# Patient Record
Sex: Female | Born: 1990 | Race: White | Hispanic: No | Marital: Single | Smoking: Never smoker
Health system: Southern US, Community
[De-identification: ages and names within clinical notes are randomized; demographics above are authoritative.]

## PROBLEM LIST (undated history)

## (undated) DIAGNOSIS — F329 Major depressive disorder, single episode, unspecified: Secondary | ICD-10-CM

## (undated) DIAGNOSIS — R87629 Unspecified abnormal cytological findings in specimens from vagina: Secondary | ICD-10-CM

## (undated) DIAGNOSIS — O99345 Other mental disorders complicating the puerperium: Secondary | ICD-10-CM

## (undated) DIAGNOSIS — F32A Depression, unspecified: Secondary | ICD-10-CM

## (undated) DIAGNOSIS — F53 Postpartum depression: Secondary | ICD-10-CM

## (undated) HISTORY — DX: Unspecified abnormal cytological findings in specimens from vagina: R87.629

## (undated) HISTORY — DX: Postpartum depression: F53.0

## (undated) HISTORY — DX: Other mental disorders complicating the puerperium: O99.345

## (undated) HISTORY — DX: Depression, unspecified: F32.A

## (undated) HISTORY — PX: OTHER SURGICAL HISTORY: SHX169

---
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
## 2016-07-02 ENCOUNTER — Ambulatory Visit (INDEPENDENT_AMBULATORY_CARE_PROVIDER_SITE_OTHER): Payer: Medicaid Other | Admitting: Certified Nurse Midwife

## 2016-07-02 VITALS — BP 118/70 | HR 91 | Ht 64.0 in | Wt 157.0 lb

## 2016-07-02 DIAGNOSIS — Z113 Encounter for screening for infections with a predominantly sexual mode of transmission: Secondary | ICD-10-CM

## 2016-07-02 DIAGNOSIS — Z1389 Encounter for screening for other disorder: Secondary | ICD-10-CM

## 2016-07-02 DIAGNOSIS — T7589XA Other specified effects of external causes, initial encounter: Secondary | ICD-10-CM

## 2016-07-02 DIAGNOSIS — Z8279 Family history of other congenital malformations, deformations and chromosomal abnormalities: Secondary | ICD-10-CM

## 2016-07-02 DIAGNOSIS — N912 Amenorrhea, unspecified: Secondary | ICD-10-CM

## 2016-07-02 DIAGNOSIS — Z369 Encounter for antenatal screening, unspecified: Secondary | ICD-10-CM

## 2016-07-02 DIAGNOSIS — Z3401 Encounter for supervision of normal first pregnancy, first trimester: Secondary | ICD-10-CM

## 2016-07-02 NOTE — Patient Instructions (Signed)
Pregnancy and Toxoplasmosis Toxoplasmosis is an infection that is caused by a parasite. Usually, there are no symptoms and the body can fight off the infection. If you get toxoplasmosis during pregnancy, there is a chance that the infection will spread to your baby. If this happens, your baby may develop serious health problems, such as blindness, intellectual disabilities, and other neurological disorders. Some of these problems may not show up for years. How do people get toxoplasmosis? You can get toxoplasmosis if:  You touch anything that is contaminated with infected cat feces and then you touch your mouth.  You eat raw or undercooked meat from an infected animal.  You eat fruits and vegetables that were grown in contaminated soil. Your baby can get toxoplasmosis through your blood supply if you are infected during pregnancy or just before pregnancy. How can I protect myself and my baby against toxoplasmosis?  Do not get a new cat.  Do not touch stray cats.  If you have a sandbox, cover it when it is not being used.  Avoid working in soil where cats may leave feces.  Wear gloves when you work in the soil. Wash your hands with soap and water when you are finished.  If you have a cat:  Have someone else change the cat's litter box daily. He or she should wash his or her hands afterward.  Do not let your cat outside.  Do not feed your cat any raw meat.  Do not eat undercooked meat, especially meat that has never been frozen.  Wash and peel all fruits and vegetables before eating them.  Avoid drinking untreated water.  If you have toxoplasmosis and you are not pregnant, wait at least 6 months before becoming pregnant. How do I know if I have toxoplasmosis? The only way to know for sure that you have toxoplasmosis is with a test. People with toxoplasmosis do not always have symptoms. If symptoms are present, they may include:  A fever.  Swollen glands.  Muscle  aches.  Headaches.  Feeling like you have a cold or the flu. If you think you have toxoplasmosis, or if you think that you may have been exposed to it, call your health care provider. How is toxoplasmosis diagnosed? When you become pregnant, your health care provider may order a blood test to check whether you have ever had toxoplasmosis.  If you have had toxoplasmosis infection before, you cannot get it again.  If you have never had toxoplasmosis, your health care provider may repeat this test at a later date.  If you become infected during pregnancy, your health care provider may do more tests to find out whether the infection has spread to the baby.  Other tests may include an ultrasound and a test of your amniotic fluid (amniocentesis). How is toxoplasmosis treated? Toxoplasmosis may be treated with antibiotics and other medicines.  Some of these medicines can lower your baby's chance of developing complications later on.  Medicines may need to be taken for up to one year. What should I do at home if I am diagnosed with toxoplasmosis?  Take over-the-counter and prescription medicines only as told by your health care provider.  If you were prescribed an antibiotic medicine, take it as told by your health care provider. Do not stop taking the antibiotic even if you start to feel better.  Keep all follow-up visits as told by your health care provider. This is important. This information is not intended to replace advice given to   you by your health care provider. Make sure you discuss any questions you have with your health care provider. Document Released: 07/19/2000 Document Revised: 12/11/2015 Document Reviewed: 11/16/2015 Elsevier Interactive Patient Education  2017 [employer]. Pregnancy and Zika Virus Disease Zika virus disease, or Zika, is an illness that can spread to people from mosquitoes that carry the virus. It may also spread from person to person through infected body  fluids. Zika first occurred in [place], but recently it has spread to new areas. The virus occurs in tropical climates. The location of Zika continues to change. Most people who become infected with Zika virus do not develop serious illness. However, Zika may cause birth defects in an unborn baby whose mother is infected with the virus. It may also increase the risk of miscarriage. What are the symptoms of Zika virus disease? In many cases, people who have been infected with Zika virus do not develop any symptoms. If symptoms appear, they usually start about a week after the person is infected. Symptoms are usually mild. They may include:  Fever.  Rash.  Red eyes.  Joint pain. How does Zika virus disease spread? The main way that Zika virus spreads is through the bite of a certain type of mosquito. Unlike most types of mosquitos, which bite only at night, the type of mosquito that carries Zika virus bites both at night and during the day. Zika virus can also spread through sexual contact, through a blood transfusion, and from a mother to her baby before or during birth. Once you have had Zika virus disease, it is unlikely that you will get it again. Can I pass Zika to my baby during pregnancy? Yes, Zika can pass from a mother to her baby before or during birth. What problems can Zika cause for my baby? A woman who is infected with Zika virus while pregnant is at risk of having her baby born with a condition in which the brain or head is smaller than expected (microcephaly). Babies who have microcephaly can have developmental delays, seizures, hearing problems, and vision problems. Having Zika virus disease during pregnancy can also increase the risk of miscarriage. How can Zika virus disease be prevented? There is no vaccine to prevent Zika. The best way to prevent the disease is to avoid infected mosquitoes and avoid exposure to body fluids that can spread the virus. Avoid any possible exposure  to Zika by taking the following precautions. For women and their sex partners:  Avoid traveling to high-risk areas. The locations where [place] is being reported change often. To identify high-risk areas, check the CDC travel website: http://davidson-gomez.com/  If you or your sex partner must travel to a high-risk area, talk with a health care provider before and after traveling.  Take all precautions to avoid mosquito bites if you live in, or travel to, any of the high-risk areas. Insect repellents are safe to use during pregnancy.  Ask your health care provider when it is safe to have sexual contact. For women:  If you are pregnant or trying to become pregnant, avoid sexual contact with persons who may have been exposed to [place] virus, persons who have possible symptoms of Zika, or persons whose history you are unsure about. If you choose to have sexual contact with someone who may have been exposed to [place] virus, use condoms correctly during the entire duration of sexual activity, every time. Do not share sexual devices, as you may be exposed to body fluids.  Ask your  health care provider about when it is safe to attempt pregnancy after a possible exposure to Zika virus. What steps should I take to avoid mosquito bites? Take these steps to avoid mosquito bites when you are in a high-risk area:  Wear loose clothing that covers your arms and legs.  Limit your outdoor activities.  Do not open windows unless they have window screens.  Sleep under mosquito nets.  Use insect repellent. The best insect repellents have:  DEET, picaridin, oil of lemon eucalyptus (OLE), or IR3535 in them.  Higher amounts of an active ingredient in them.  Remember that insect repellents are safe to use during pregnancy.  Do not use OLE on children who are younger than 293 years of age. Do not use insect repellent on babies who are younger than 382 months of age.  Cover your child's stroller with mosquito  netting. Make sure the netting fits snugly and that any loose netting does not cover your child's mouth or nose. Do not use a blanket as a mosquito-protection cover.  Do not apply insect repellent underneath clothing.  If you are using sunscreen, apply the sunscreen before applying the insect repellent.  Treat clothing with permethrin. Do not apply permethrin directly to your skin. Follow label directions for safe use.  Get rid of standing water, where mosquitoes may reproduce. Standing water is often found in items such as buckets, bowls, animal food dishes, and flowerpots. When you return from traveling to any high-risk area, continue taking actions to protect yourself against mosquito bites for 3 weeks, even if you show no signs of illness. This will prevent spreading Zika virus to uninfected mosquitoes. What should I know about the sexual transmission of Zika? People can spread Zika to their sexual partners during vaginal, anal, or oral sex, or by sharing sexual devices. Many people with [place] do not develop symptoms, so a person could spread the disease without knowing that they are infected. The greatest risk is to women who are pregnant or who may become pregnant. Zika virus can live longer in semen than it can live in blood. Couples can prevent sexual transmission of the virus by:  Using condoms correctly during the entire duration of sexual activity, every time. This includes vaginal, anal, and oral sex.  Not sharing sexual devices. Sharing increases your risk of being exposed to body fluid from another person.  Avoiding all sexual activity until your health care provider says it is safe. Should I be tested for Zika virus? A sample of your blood can be tested for Zika virus. A pregnant woman should be tested if she may have been exposed to the virus or if she has symptoms of Zika. She may also have additional tests done during her pregnancy, such ultrasound testing. Talk with your health  care provider about which tests are recommended. This information is not intended to replace advice given to you by your health care provider. Make sure you discuss any questions you have with your health care provider. Document Released: 01/01/2015 Document Revised: 09/18/2015 Document Reviewed: 12/25/2014 Elsevier Interactive Patient Education  2017 Else[employer] Inc. Minor Illnesses and Medications in Pregnancy  Cold/Flu:  Sudafed for congestion- Robitussin (plain) for cough- Tylenol for discomfort.  Please follow the directions on the label.  Try not to take any more than needed.  OTC Saline nasal spray and air humidifier or cool-mist  Vaporizer to sooth nasal irritation and to loosen congestion.  It is also important to increase intake of non carbonated  fluids, especially if you have a fever.  Constipation:  Colace-2 capsules at bedtime; Metamucil- follow directions on label; Senokot- 1 tablet at bedtime.  Any one of these medications can be used.  It is also very important to increase fluids and fruits along with regular exercise.  If problem persists please call the office.  Diarrhea:  Kaopectate as directed on the label.  Eat a bland diet and increase fluids.  Avoid highly seasoned foods.  Headache:  Tylenol 1 or 2 tablets every 3-4 hours as needed  Indigestion:  Maalox, Mylanta, Tums or Rolaids- as directed on label.  Also try to eat small meals and avoid fatty, greasy or spicy foods.  Nausea with or without Vomiting:  Nausea in pregnancy is caused by increased levels of hormones in the body which influence the digestive system and cause irritation when stomach acids accumulate.  Symptoms usually subside after 1st trimester of pregnancy.  Try the following: 1. Keep saltines, graham crackers or dry toast by your bed to eat upon awakening. 2. Don't let your stomach get empty.  Try to eat 5-6 small meals per day instead of 3 large ones. 3. Avoid greasy fatty or highly seasoned foods.  4. Take  OTC Unisom 1 tablet at bed time along with OTC Vitamin B6 25-50 mg 3 times per day.    If nausea continues with vomiting and you are unable to keep down food and fluids you may need a prescription medication.  Please notify your provider.   Sore throat:  Chloraseptic spray, throat lozenges and or plain Tylenol.  Vaginal Yeast Infection:  OTC Monistat for 7 days as directed on label.  If symptoms do not resolve within a week notify provider.  If any of the above problems do not subside with recommended treatment please call the office for further assistance.   Do not take Aspirin, Advil, Motrin or Ibuprofen.  * * OTC= Over the counter Hyperemesis Gravidarum Hyperemesis gravidarum is a severe form of nausea and vomiting that happens during pregnancy. Hyperemesis is worse than morning sickness. It may cause you to have nausea or vomiting all day for many days. It may keep you from eating and drinking enough food and liquids. Hyperemesis usually occurs during the first half (the first 20 weeks) of pregnancy. It often goes away once a woman is in her second half of pregnancy. However, sometimes hyperemesis continues through an entire pregnancy. What are the causes? The cause of this condition is not known. It may be related to changes in chemicals (hormones) in the body during pregnancy, such as the high level of pregnancy hormone (human chorionic gonadotropin) or the increase in the female sex hormone (estrogen). What are the signs or symptoms? Symptoms of this condition include:  Severe nausea and vomiting.  Nausea that does not go away.  Vomiting that does not allow you to keep any food down.  Weight loss.  Body fluid loss (dehydration).  Having no desire to eat, or not liking food that you have previously enjoyed. How is this diagnosed? This condition may be diagnosed based on:  A physical exam.  Your medical history.  Your symptoms.  Blood tests.  Urine tests. How is this  treated? This condition may be managed with medicine. If medicines to do not help relieve nausea and vomiting, you may need to receive fluids through an IV tube at the hospital. Follow these instructions at home:  Take over-the-counter and prescription medicines only as told by your health care  provider.  Avoid iron pills and multivitamins that contain iron for the first 3-4 months of pregnancy. If you take prescription iron pills, do not stop taking them unless your health care provider approves.  Take the following actions to help prevent nausea and vomiting:  In the morning, before getting out of bed, try eating a couple of dry crackers or a piece of toast.  Avoid foods and smells that upset your stomach. Fatty and spicy foods may make nausea worse.  Eat 5-6 small meals a day.  Do not drink fluids while eating meals. Drink between meals.  Eat or suck on things that have ginger in them. Ginger can help relieve nausea.  Avoid food preparation. The smell of food can spoil your appetite or trigger nausea.  Follow instructions from your health care provider about eating or drinking restrictions.  For snacks, eat high-protein foods, such as cheese.  Keep all follow-up and pre-birth (prenatal) visits as told by your health care provider. This is important. Contact a health care provider if:  You have pain in your abdomen.  You have a severe headache.  You have vision problems.  You are losing weight. Get help right away if:  You cannot drink fluids without vomiting.  You vomit blood.  You have constant nausea and vomiting.  You are very weak.  You are very thirsty.  You feel dizzy.  You faint.  You have a fever or other symptoms that last for more than 2-3 days.  You have a fever and your symptoms suddenly get worse. Summary  Hyperemesis gravidarum is a severe form of nausea and vomiting that happens during pregnancy.  Making some changes to your eating habits  may help relieve nausea and vomiting.  This condition may be managed with medicine.  If medicines to do not help relieve nausea and vomiting, you may need to receive fluids through an IV tube at the hospital. This information is not intended to replace advice given to you by your health care provider. Make sure you discuss any questions you have with your health care provider. Document Released: 04/12/2005 Document Revised: 12/10/2015 Document Reviewed: 12/10/2015 Elsevier Interactive Patient Education  2017 [employer]. First Trimester of Pregnancy The first trimester of pregnancy is from week 1 until the end of week 13 (months 1 through 3). During this time, your baby will begin to develop inside you. At 6-8 weeks, the eyes and face are formed, and the heartbeat can be seen on ultrasound. At the end of 12 weeks, all the baby's organs are formed. Prenatal care is all the medical care you receive before the birth of your baby. Make sure you get good prenatal care and follow all of your doctor's instructions. Follow these instructions at home: Medicines   Take over-the-counter and prescription medicines only as told by your doctor. Some medicines are safe and some medicines are not safe during pregnancy.  Take a prenatal vitamin that contains at least 600 micrograms (mcg) of folic acid.  If you have trouble pooping (constipation), take medicine that will make your stool soft (stool softener) if your doctor approves. Eating and drinking   Eat regular, healthy meals.  Your doctor will tell you the amount of weight gain that is right for you.  Avoid raw meat and uncooked cheese.  If you feel sick to your stomach (nauseous) or throw up (vomit):  Eat 4 or 5 small meals a day instead of 3 large meals.  Try eating a few soda  crackers.  Drink liquids between meals instead of during meals.  To prevent constipation:  Eat foods that are high in fiber, like fresh fruits and vegetables, whole  grains, and beans.  Drink enough fluids to keep your pee (urine) clear or pale yellow. Activity   Exercise only as told by your doctor. Stop exercising if you have cramps or pain in your lower belly (abdomen) or low back.  Do not exercise if it is too hot, too humid, or if you are in a place of great height (high altitude).  Try to avoid standing for long periods of time. Move your legs often if you must stand in one place for a long time.  Avoid heavy lifting.  Wear low-heeled shoes. Sit and stand up straight.  You can have sex unless your doctor tells you not to. Relieving pain and discomfort   Wear a good support bra if your breasts are sore.  Take warm water baths (sitz baths) to soothe pain or discomfort caused by hemorrhoids. Use hemorrhoid cream if your doctor says it is okay.  Rest with your legs raised if you have leg cramps or low back pain.  If you have puffy, bulging veins (varicose veins) in your legs:  Wear support hose or compression stockings as told by your doctor.  Raise (elevate) your feet for 15 minutes, 3-4 times a day.  Limit salt in your food. Prenatal care   Schedule your prenatal visits by the twelfth week of pregnancy.  Write down your questions. Take them to your prenatal visits.  Keep all your prenatal visits as told by your doctor. This is important. Safety   Wear your seat belt at all times when driving.  Make a list of emergency phone numbers. The list should include numbers for family, friends, the hospital, and police and fire departments. General instructions   Ask your doctor for a referral to a local prenatal class. Begin classes no later than at the start of month 6 of your pregnancy.  Ask for help if you need counseling or if you need help with nutrition. Your doctor can give you advice or tell you where to go for help.  Do not use hot tubs, steam rooms, or saunas.  Do not douche or use tampons or scented sanitary pads.  Do  not cross your legs for long periods of time.  Avoid all herbs and alcohol. Avoid drugs that are not approved by your doctor.  Do not use any tobacco products, including cigarettes, chewing tobacco, and electronic cigarettes. If you need help quitting, ask your doctor. You may get counseling or other support to help you quit.  Avoid cat litter boxes and soil used by cats. These carry germs that can cause birth defects in the baby and can cause a loss of your baby (miscarriage) or stillbirth.  Visit your dentist. At home, brush your teeth with a soft toothbrush. Be gentle when you floss. Contact a doctor if:  You are dizzy.  You have mild cramps or pressure in your lower belly.  You have a nagging pain in your belly area.  You continue to feel sick to your stomach, you throw up, or you have watery poop (diarrhea).  You have a bad smelling fluid coming from your vagina.  You have pain when you pee (urinate).  You have increased puffiness (swelling) in your face, hands, legs, or ankles. Get help right away if:  You have a fever.  You are leaking fluid from  your vagina.  You have spotting or bleeding from your vagina.  You have very bad belly cramping or pain.  You gain or lose weight rapidly.  You throw up blood. It may look like coffee grounds.  You are around people who have [place] measles, fifth disease, or chickenpox.  You have a very bad headache.  You have shortness of breath.  You have any kind of trauma, such as from a fall or a car accident. Summary  The first trimester of pregnancy is from week 1 until the end of week 13 (months 1 through 3).  To take care of yourself and your unborn baby, you will need to eat healthy meals, take medicines only if your doctor tells you to do so, and do activities that are safe for you and your baby.  Keep all follow-up visits as told by your doctor. This is important as your doctor will have to ensure that your baby is  healthy and growing well. This information is not intended to replace advice given to you by your health care provider. Make sure you discuss any questions you have with your health care provider. Document Released: 09/29/2007 Document Revised: 04/20/2016 Document Reviewed: 04/20/2016 Elsevier Interactive Patient Education  2017 [employer] Inc. Commonly Asked Questions During Pregnancy  Cats: A parasite can be excreted in cat feces.  To avoid exposure you need to have another person empty the little box.  If you must empty the litter box you will need to wear gloves.  Wash your hands after handling your cat.  This parasite can also be found in raw or undercooked meat so this should also be avoided.  Colds, Sore Throats, Flu: Please check your medication sheet to see what you can take for symptoms.  If your symptoms are unrelieved by these medications please call the office.  Dental Work: Most any dental work Agricultural consultant recommends is permitted.  X-rays should only be taken during the first trimester if absolutely necessary.  Your abdomen should be shielded with a lead apron during all x-rays.  Please notify your provider prior to receiving any x-rays.  Novocaine is fine; gas is not recommended.  If your dentist requires a note from [place] prior to dental work please call the office and we will provide one for you.  Exercise: Exercise is an important part of staying healthy during your pregnancy.  You may continue most exercises you were accustomed to prior to pregnancy.  Later in your pregnancy you will most likely notice you have difficulty with activities requiring balance like riding a bicycle.  It is important that you listen to your body and avoid activities that put you at a higher risk of falling.  Adequate rest and staying well hydrated are a must!  If you have questions about the safety of specific activities ask your provider.    Exposure to Children with illness: Try to avoid obvious exposure;  report any symptoms to [place] when noted,  If you have chicken pos, red measles or mumps, you should be immune to these diseases.   Please do not take any vaccines while pregnant unless you have checked with your OB provider.  Fetal Movement: After 28 weeks we recommend you do "kick counts" twice daily.  Lie or sit down in a calm quiet environment and count your baby movements "kicks".  You should feel your baby at least 10 times per hour.  If you have not felt 10 kicks within the first hour get up,  walk around and have something sweet to eat or drink then repeat for an additional hour.  If count remains less than 10 per hour notify your provider.  Fumigating: Follow your pest control agent's advice as to how long to stay out of your home.  Ventilate the area well before re-entering.  Hemorrhoids:   Most over-the-counter preparations can be used during pregnancy.  Check your medication to see what is safe to use.  It is important to use a stool softener or fiber in your diet and to drink lots of liquids.  If hemorrhoids seem to be getting worse please call the office.   Hot Tubs:  Hot tubs Jacuzzis and saunas are not recommended while pregnant.  These increase your internal body temperature and should be avoided.  Intercourse:  Sexual intercourse is safe during pregnancy as long as you are comfortable, unless otherwise advised by your provider.  Spotting may occur after intercourse; report any bright red bleeding that is heavier than spotting.  Labor:  If you know that you are in labor, please go to the hospital.  If you are unsure, please call the office and let us help you decide what to do.  Lifting, straining, etc:  If your job requires heavy lifting or straining please check with your provider for any limitations.  Generally, you should not lift items heavier than that you can lift simply with your hands and arms (no back muscles)  Painting:  Paint fumes do not harm your pregnancy, but may make you  ill and should be avoided if possible.  Latex or water based paints have less odor than oils.  Use adequate ventilation while painting.  Permanents & Hair Color:  Chemicals in hair dyes are not recommended as they cause increase hair dryness which can increase hair loss during pregnancy.  " Highlighting" and permanents are allowed.  Dye may be absorbed differently and permanents may not hold as well during pregnancy.  Sunbathing:  Use a sunscreen, as skin burns easily during pregnancy.  Drink plenty of fluids; avoid over heating.  Tanning Beds:  Because their possible side effects are still unknown, tanning beds are not recommended.  Ultrasound Scans:  Routine ultrasounds are performed at approximately 20 weeks.  You will be able to see your baby's general anatomy an if you would like to know the gender this can usually be determined as well.  If it is questionable when you conceived you may also receive an ultrasound early in your pregnancy for dating purposes.  Otherwise ultrasound exams are not routinely performed unless there is a medical necessity.  Although you can request a scan we ask that you pay for it when conducted because insurance does not cover " patient request" scans.  Work: If your pregnancy proceeds without complications you may work until your due date, unless your physician or employer advises otherwise.  Round Ligament Pain/Pelvic Discomfort:  Sharp, shooting pains not associated with bleeding are fairly common, usually occurring in the second trimester of pregnancy.  They tend to be worse when standing up or when you remain standing for long periods of time.  These are the result of pressure of certain pelvic ligaments called "round ligaments".  Rest, Tylenol and heat seem to be the most effective relief.  As the womb and fetus grow, they rise out of the pelvis and the discomfort improves.  Please notify the office if your pain seems different than that described.  It may represent  a more serious  condition.

## 2016-07-02 NOTE — Progress Notes (Signed)
Lindsay RumbleAllison Mcdowell presents for NOB nurse interview visit. Pregnancy confirmation done at [facility] on 05/17/2016.  Lmp: 04/11/2016 (shorter than usual). G-1.  P-0000. Pregnancy education material explained and given. Has cat in the home. She is unsure whether or not she changed the litter box since pregnant but will do a Toxoplasmosis.  NOB labs ordered.  HIV labs and Drug screen were explained optional and she did not decline. Drug screen ordered. PNV encouraged. Genetic screening options discussed. Genetic testing: Ordered-MaterniT.  Ultrasound ordered due to irregular menses for dating and viability. Pt. To follow up with provider in 1 weeks for NOB physical.  All questions answered.

## 2016-07-04 LAB — GC/CHLAMYDIA PROBE AMP
Chlamydia trachomatis, NAA: NEGATIVE
Neisseria gonorrhoeae by PCR: NEGATIVE

## 2016-07-05 ENCOUNTER — Ambulatory Visit (INDEPENDENT_AMBULATORY_CARE_PROVIDER_SITE_OTHER): Payer: Medicaid Other

## 2016-07-05 DIAGNOSIS — Z3401 Encounter for supervision of normal first pregnancy, first trimester: Secondary | ICD-10-CM

## 2016-07-05 LAB — MONITOR DRUG PROFILE 14(MW)
Amphetamine Scrn, Ur: NEGATIVE ng/mL
BARBITURATE SCREEN URINE: NEGATIVE ng/mL
BENZODIAZEPINE SCREEN, URINE: NEGATIVE ng/mL
BUPRENORPHINE, URINE: NEGATIVE ng/mL
CANNABINOIDS UR QL SCN: NEGATIVE ng/mL
COCAINE(METAB.)SCREEN, URINE: NEGATIVE ng/mL
CREATININE(CRT), U: 67 mg/dL (ref 20.0–300.0)
Fentanyl, Urine: NEGATIVE pg/mL
MEPERIDINE SCREEN, URINE: NEGATIVE ng/mL
METHADONE SCREEN, URINE: NEGATIVE ng/mL
OXYCODONE+OXYMORPHONE UR QL SCN: NEGATIVE ng/mL
Opiate Scrn, Ur: NEGATIVE ng/mL
PH UR, DRUG SCRN: 6.1 (ref 4.5–8.9)
PHENCYCLIDINE QUANTITATIVE URINE: NEGATIVE ng/mL
Propoxyphene Scrn, Ur: NEGATIVE ng/mL
SPECIFIC GRAVITY: 1.009
Tramadol Screen, Urine: NEGATIVE ng/mL

## 2016-07-05 LAB — HIV ANTIBODY (ROUTINE TESTING W REFLEX): HIV Screen 4th Generation wRfx: NONREACTIVE

## 2016-07-05 LAB — CBC WITH DIFFERENTIAL/PLATELET
BASOS ABS: 0 10*3/uL (ref 0.0–0.2)
Basos: 0 %
EOS (ABSOLUTE): 0.1 10*3/uL (ref 0.0–0.4)
Eos: 1 %
HEMOGLOBIN: 12.8 g/dL (ref 11.1–15.9)
Hematocrit: 37 % (ref 34.0–46.6)
IMMATURE GRANS (ABS): 0 10*3/uL (ref 0.0–0.1)
Immature Granulocytes: 0 %
LYMPHS ABS: 1.4 10*3/uL (ref 0.7–3.1)
LYMPHS: 17 %
MCH: 30.3 pg (ref 26.6–33.0)
MCHC: 34.6 g/dL (ref 31.5–35.7)
MCV: 88 fL (ref 79–97)
MONOCYTES: 8 %
Monocytes Absolute: 0.7 10*3/uL (ref 0.1–0.9)
Neutrophils Absolute: 6.1 10*3/uL (ref 1.4–7.0)
Neutrophils: 74 %
PLATELETS: 313 10*3/uL (ref 150–379)
RBC: 4.22 x10E6/uL (ref 3.77–5.28)
RDW: 13.3 % (ref 12.3–15.4)
WBC: 8.3 10*3/uL (ref 3.4–10.8)

## 2016-07-05 LAB — URINALYSIS, ROUTINE W REFLEX MICROSCOPIC
BILIRUBIN UA: NEGATIVE
Glucose, UA: NEGATIVE
Ketones, UA: NEGATIVE
Nitrite, UA: POSITIVE — AB
PH UA: 6.5 (ref 5.0–7.5)
PROTEIN UA: NEGATIVE
RBC UA: NEGATIVE
Specific Gravity, UA: 1.011 (ref 1.005–1.030)
Urobilinogen, Ur: 0.2 mg/dL (ref 0.2–1.0)

## 2016-07-05 LAB — MICROSCOPIC EXAMINATION: Casts: NONE SEEN /LPF

## 2016-07-05 LAB — RUBELLA SCREEN: Rubella Antibodies, IGG: <0.9 index — ABNORMAL LOW (ref >0.99)

## 2016-07-05 LAB — RH TYPE: RH TYPE: POSITIVE

## 2016-07-05 LAB — NICOTINE SCREEN, URINE: COTININE UR QL SCN: NEGATIVE ng/mL

## 2016-07-05 LAB — HEPATITIS B SURFACE ANTIGEN: HEP B S AG: NEGATIVE

## 2016-07-05 LAB — ABO

## 2016-07-05 LAB — VARICELLA ZOSTER ANTIBODY, IGG: Varicella zoster IgG: 1768 index (ref >165)

## 2016-07-05 LAB — TOXOPLASMA ANTIBODIES- IGG AND  IGM: Toxoplasma IgG Ratio: <3 IU/mL (ref 0.0–7.1)

## 2016-07-05 LAB — RPR: RPR: NONREACTIVE

## 2016-07-05 LAB — ANTIBODY SCREEN: ANTIBODY SCREEN: NEGATIVE

## 2016-07-06 ENCOUNTER — Other Ambulatory Visit: Payer: Self-pay | Admitting: Certified Nurse Midwife

## 2016-07-06 ENCOUNTER — Encounter: Payer: Self-pay | Admitting: Certified Nurse Midwife

## 2016-07-06 MED ORDER — NITROFURANTOIN MONOHYD MACRO 100 MG PO CAPS: 100.0000 mg | ORAL_CAPSULE | Freq: Two times a day (BID) | ORAL | 0 refills | Status: AC

## 2016-07-06 NOTE — Progress Notes (Signed)
U/A was positive for UTI, will treat with Macrobid. Message sent on my chart.   Doreene BurkeAnnie Brittnae Aschenbrenner, CNM

## 2016-07-09 ENCOUNTER — Other Ambulatory Visit: Payer: Self-pay

## 2016-07-09 ENCOUNTER — Encounter: Payer: Self-pay | Admitting: Certified Nurse Midwife

## 2016-07-09 ENCOUNTER — Ambulatory Visit (INDEPENDENT_AMBULATORY_CARE_PROVIDER_SITE_OTHER): Payer: Medicaid Other | Admitting: Certified Nurse Midwife

## 2016-07-09 ENCOUNTER — Other Ambulatory Visit: Payer: Self-pay | Admitting: Certified Nurse Midwife

## 2016-07-09 VITALS — BP 103/67 | HR 72 | Wt 159.3 lb

## 2016-07-09 DIAGNOSIS — N898 Other specified noninflammatory disorders of vagina: Secondary | ICD-10-CM | POA: Diagnosis not present

## 2016-07-09 DIAGNOSIS — Z3401 Encounter for supervision of normal first pregnancy, first trimester: Secondary | ICD-10-CM

## 2016-07-09 DIAGNOSIS — Z3481 Encounter for supervision of other normal pregnancy, first trimester: Secondary | ICD-10-CM | POA: Diagnosis not present

## 2016-07-09 DIAGNOSIS — Z369 Encounter for antenatal screening, unspecified: Secondary | ICD-10-CM

## 2016-07-09 LAB — POCT URINALYSIS DIPSTICK
Bilirubin, UA: NEGATIVE
Glucose, UA: NEGATIVE
Ketones, UA: NEGATIVE
NITRITE UA: NEGATIVE
PROTEIN UA: NEGATIVE
RBC UA: NEGATIVE
SPEC GRAV UA: 1.005 (ref 1.030–1.035)
UROBILINOGEN UA: NEGATIVE (ref ?–2.0)
pH, UA: 7 (ref 5.0–8.0)

## 2016-07-09 NOTE — Progress Notes (Signed)
NEW OB HISTORY AND PHYSICAL  SUBJECTIVE:       Lindsay Mcdowell is a 26 y.o. G1P0 female, Patient's last menstrual period was 04/11/2016 (exact date)., Estimated Date of Delivery: 01/16/17, [redacted]w[redacted]d, presents today for establishment of Prenatal Care. She has no unusual complaints.     Gynecologic History Patient's last menstrual period was 04/11/2016 (exact date). Normal  Contraception: none Last Pap: "a few yrs ago". Results were: normal per pt.   Obstetric History OB History  Gravida Para Term Preterm AB Living  1            SAB TAB Ectopic Multiple Live Births               # Outcome Date GA Lbr Len/2nd Weight Sex Delivery Anes PTL Lv  1 Current               History reviewed. No pertinent past medical history.  Past Surgical History:  Procedure Laterality Date  . none      Current Outpatient Prescriptions on File Prior to Visit  Medication Sig Dispense Refill  . nitrofurantoin, macrocrystal-monohydrate, (MACROBID) 100 MG capsule Take 1 capsule (100 mg total) by mouth 2 (two) times daily. 14 capsule 0  . Prenatal Vit-Fe Fumarate-FA (PRENATAL VITAMINS) 28-0.8 MG TABS Take by mouth.     No current facility-administered medications on file prior to visit.     Allergies  Allergen Reactions  . Codeine Itching    Pt unsure of reaction, fhx of allergy    Social History   Social History  . Marital status: Single    Spouse name: N/A  . Number of children: N/A  . Years of education: N/A   Occupational History  . Not on file.   Social History Main Topics  . Smoking status: Never Smoker  . Smokeless tobacco: Never Used  . Alcohol use No     Comment: not since pregnant  . Drug use: No  . Sexual activity: Yes    Partners: Male    Birth control/ protection: None   Other Topics Concern  . Not on file   Social History Narrative  . No narrative on file    Family History  Problem Relation Age of Onset  . Hypertension Mother   . Migraines Mother   . Asthma Maternal  Grandmother   . Diabetes Maternal Grandmother   . Rashes / Skin problems Maternal Grandmother     eczema    The following portions of the patient's history were reviewed and updated as appropriate: allergies, current medications, past OB history, past medical history, past surgical history, past family history, past social history, and problem list.  Review of Systems  Constitutional: Negative.   HENT: Negative.   Eyes: Negative.   Respiratory: Negative.   Cardiovascular: Negative.   Gastrointestinal: Negative.   Genitourinary: Negative.        Vaginal discharge and itching  Musculoskeletal: Negative.   Skin: Negative.   Neurological: Negative.   Endo/Heme/Allergies: Negative.   Psychiatric/Behavioral: Negative.      OBJECTIVE: Initial Physical Exam (New OB)  GENERAL APPEARANCE: alert, well appearing, in no apparent distress, oriented to person, place and time HEAD: normocephalic, atraumatic MOUTH: mucous membranes moist, pharynx normal without lesions THYROID: no thyromegaly or masses present BREASTS: no masses noted, no significant tenderness, no palpable axillary nodes, no skin changes LUNGS: clear to auscultation, no wheezes, rales or rhonchi, symmetric air entry HEART: regular rate and rhythm, no murmurs ABDOMEN: soft, nontender, nondistended, no  abnormal masses, no epigastric pain EXTREMITIES: no redness or tenderness in the calves or thighs SKIN: normal coloration and turgor, no rashes LYMPH NODES: no adenopathy palpable NEUROLOGIC: alert, oriented, normal speech, no focal findings or movement disorder noted  PELVIC EXAM EXTERNAL GENITALIA: normal appearing vulva with no masses, tenderness or lesions VAGINA: white discharge with redness CERVIX: no lesions or cervical motion tenderness and discharge white particulate  UTERUS: gravid and consistent with 12 weeks ADNEXA: no masses palpable and nontender OB EXAM PELVIMETRY: appears adequate RECTUM: exam not  indicated  ASSESSMENT: Normal pregnancy Yeast infection  PLAN: Prenatal care New OB counseling:The patient has been given an overview regarding routine prenatal care. Recommendations regarding diet, medications, weight gain, and exercise in pregnancy were given. Prenatal testing, Meternit redrawn today. Benefits of Breast Feeding were discussed. The patient is encouraged to consider nursing her baby post partum. Nuswab completed with pap, will send my chart message with results. Treat yeast with Monistat. ROB 4 wks.   Doreene BurkeAnnie Yarenis Cerino, CNM  See orders

## 2016-07-09 NOTE — Progress Notes (Signed)
NOB pe- last pap 11/2014- wnl/ C/o y/I- white d/c and itch. No odor.

## 2016-07-09 NOTE — Patient Instructions (Signed)
Minor Illnesses and Medications in Pregnancy  Cold/Flu:  Sudafed for congestion- Robitussin (plain) for cough- Tylenol for discomfort.  Please follow the directions on the label.  Try not to take any more than needed.  OTC Saline nasal spray and air humidifier or cool-mist  Vaporizer to sooth nasal irritation and to loosen congestion.  It is also important to increase intake of non carbonated fluids, especially if you have a fever.  Constipation:  Colace-2 capsules at bedtime; Metamucil- follow directions on label; Senokot- 1 tablet at bedtime.  Any one of these medications can be used.  It is also very important to increase fluids and fruits along with regular exercise.  If problem persists please call the office.  Diarrhea:  Kaopectate as directed on the label.  Eat a bland diet and increase fluids.  Avoid highly seasoned foods.  Headache:  Tylenol 1 or 2 tablets every 3-4 hours as needed  Indigestion:  Maalox, Mylanta, Tums or Rolaids- as directed on label.  Also try to eat small meals and avoid fatty, greasy or spicy foods.  Nausea with or without Vomiting:  Nausea in pregnancy is caused by increased levels of hormones in the body which influence the digestive system and cause irritation when stomach acids accumulate.  Symptoms usually subside after 1st trimester of pregnancy.  Try the following: 1. Keep saltines, graham crackers or dry toast by your bed to eat upon awakening. 2. Don't let your stomach get empty.  Try to eat 5-6 small meals per day instead of 3 large ones. 3. Avoid greasy fatty or highly seasoned foods.  4. Take OTC Unisom 1 tablet at bed time along with OTC Vitamin B6 25-50 mg 3 times per day.    If nausea continues with vomiting and you are unable to keep down food and fluids you may need a prescription medication.  Please notify your provider.   Sore throat:  Chloraseptic spray, throat lozenges and or plain Tylenol.  Vaginal Yeast Infection:  OTC Monistat for 7 days as  directed on label.  If symptoms do not resolve within a week notify provider.  If any of the above problems do not subside with recommended treatment please call the office for further assistance.   Do not take Aspirin, Advil, Motrin or Ibuprofen.  * * OTC= Over the counter Commonly Asked Questions During Pregnancy  Cats: A parasite can be excreted in cat feces.  To avoid exposure you need to have another person empty the little box.  If you must empty the litter box you will need to wear gloves.  Wash your hands after handling your cat.  This parasite can also be found in raw or undercooked meat so this should also be avoided.  Colds, Sore Throats, Flu: Please check your medication sheet to see what you can take for symptoms.  If your symptoms are unrelieved by these medications please call the office.  Dental Work: Most any dental work Investment banker, corporate recommends is permitted.  X-rays should only be taken during the first trimester if absolutely necessary.  Your abdomen should be shielded with a lead apron during all x-rays.  Please notify your provider prior to receiving any x-rays.  Novocaine is fine; gas is not recommended.  If your dentist requires a note from [place] prior to dental work please call the office and we will provide one for you.  Exercise: Exercise is an important part of staying healthy during your pregnancy.  You may continue most exercises you were  accustomed to prior to pregnancy.  Later in your pregnancy you will most likely notice you have difficulty with activities requiring balance like riding a bicycle.  It is important that you listen to your body and avoid activities that put you at a higher risk of falling.  Adequate rest and staying well hydrated are a must!  If you have questions about the safety of specific activities ask your provider.    Exposure to Children with illness: Try to avoid obvious exposure; report any symptoms to [place] when noted,  If you have chicken pos, red  measles or mumps, you should be immune to these diseases.   Please do not take any vaccines while pregnant unless you have checked with your OB provider.  Fetal Movement: After 28 weeks we recommend you do "kick counts" twice daily.  Lie or sit down in a calm quiet environment and count your baby movements "kicks".  You should feel your baby at least 10 times per hour.  If you have not felt 10 kicks within the first hour get up, walk around and have something sweet to eat or drink then repeat for an additional hour.  If count remains less than 10 per hour notify your provider.  Fumigating: Follow your pest control agent's advice as to how long to stay out of your home.  Ventilate the area well before re-entering.  Hemorrhoids:   Most over-the-counter preparations can be used during pregnancy.  Check your medication to see what is safe to use.  It is important to use a stool softener or fiber in your diet and to drink lots of liquids.  If hemorrhoids seem to be getting worse please call the office.   Hot Tubs:  Hot tubs Jacuzzis and saunas are not recommended while pregnant.  These increase your internal body temperature and should be avoided.  Intercourse:  Sexual intercourse is safe during pregnancy as long as you are comfortable, unless otherwise advised by your provider.  Spotting may occur after intercourse; report any bright red bleeding that is heavier than spotting.  Labor:  If you know that you are in labor, please go to the hospital.  If you are unsure, please call the office and let us help you decide what to do.  Lifting, straining, etc:  If your job requires heavy lifting or straining please check with your provider for any limitations.  Generally, you should not lift items heavier than that you can lift simply with your hands and arms (no back muscles)  Painting:  Paint fumes do not harm your pregnancy, but may make you ill and should be avoided if possible.  Latex or water based paints  have less odor than oils.  Use adequate ventilation while painting.  Permanents & Hair Color:  Chemicals in hair dyes are not recommended as they cause increase hair dryness which can increase hair loss during pregnancy.  " Highlighting" and permanents are allowed.  Dye may be absorbed differently and permanents may not hold as well during pregnancy.  Sunbathing:  Use a sunscreen, as skin burns easily during pregnancy.  Drink plenty of fluids; avoid over heating.  Tanning Beds:  Because their possible side effects are still unknown, tanning beds are not recommended.  Ultrasound Scans:  Routine ultrasounds are performed at approximately 20 weeks.  You will be able to see your baby's general anatomy an if you would like to know the gender this can usually be determined as well.  If it is questionable  when you conceived you may also receive an ultrasound early in your pregnancy for dating purposes.  Otherwise ultrasound exams are not routinely performed unless there is a medical necessity.  Although you can request a scan we ask that you pay for it when conducted because insurance does not cover " patient request" scans.  Work: If your pregnancy proceeds without complications you may work until your due date, unless your physician or employer advises otherwise.  Round Ligament Pain/Pelvic Discomfort:  Sharp, shooting pains not associated with bleeding are fairly common, usually occurring in the second trimester of pregnancy.  They tend to be worse when standing up or when you remain standing for long periods of time.  These are the result of pressure of certain pelvic ligaments called "round ligaments".  Rest, Tylenol and heat seem to be the most effective relief.  As the womb and fetus grow, they rise out of the pelvis and the discomfort improves.  Please notify the office if your pain seems different than that described.  It may represent a more serious condition.   

## 2016-07-13 ENCOUNTER — Encounter: Payer: Self-pay | Admitting: Certified Nurse Midwife

## 2016-07-13 ENCOUNTER — Other Ambulatory Visit: Payer: Self-pay | Admitting: Certified Nurse Midwife

## 2016-07-13 LAB — NUSWAB VAGINITIS PLUS (VG+)
ATOPOBIUM VAGINAE: HIGH {score} — AB
CANDIDA GLABRATA, NAA: NEGATIVE
Candida albicans, NAA: POSITIVE — AB
Chlamydia trachomatis, NAA: NEGATIVE
NEISSERIA GONORRHOEAE, NAA: NEGATIVE
Trich vag by NAA: NEGATIVE

## 2016-07-13 MED ORDER — METRONIDAZOLE 500 MG PO TABS: 500.0000 mg | ORAL_TABLET | Freq: Two times a day (BID) | ORAL | 0 refills | Status: AC

## 2016-07-13 NOTE — Progress Notes (Signed)
Nuswab results show  Atopobium vaginae Score High - 2     Treat for BV with Flagyl 500mg  BID x 7 days. Order placed to pharmacy online. My chart message sent to Revonda Standardllison to notify her of results.   Doreene BurkeAnnie Akshay Spang, CNM

## 2016-07-14 ENCOUNTER — Encounter: Payer: Self-pay | Admitting: Certified Nurse Midwife

## 2016-07-14 LAB — PAP IG W/ RFLX HPV ASCU: PAP SMEAR COMMENT: 0

## 2016-07-16 ENCOUNTER — Encounter: Payer: Self-pay | Admitting: Certified Nurse Midwife

## 2016-07-16 LAB — MATERNIT 21 PLUS CORE, BLOOD
CHROMOSOME 21: NEGATIVE
Chromosome 13: NEGATIVE
Chromosome 18: NEGATIVE
Y Chromosome: DETECTED

## 2016-07-20 ENCOUNTER — Encounter: Payer: Self-pay | Admitting: Certified Nurse Midwife

## 2016-07-20 ENCOUNTER — Other Ambulatory Visit: Payer: Self-pay | Admitting: Certified Nurse Midwife

## 2016-07-20 LAB — URINE CULTURE, OB REFLEX

## 2016-07-20 LAB — CULTURE, OB URINE

## 2016-07-20 MED ORDER — NITROFURANTOIN MONOHYD MACRO 100 MG PO CAPS: 100.0000 mg | ORAL_CAPSULE | Freq: Two times a day (BID) | ORAL | 0 refills | Status: AC

## 2016-07-20 NOTE — Progress Notes (Signed)
UTI noted with urine culture. Treat with Macrobid. Prescription sent to pharmacy on file. Pt notified via my chart message.   Doreene BurkeAnnie Gregg Holster, CNM

## 2016-07-21 LAB — MATERNIT 21 PLUS CORE, BLOOD

## 2016-07-28 ENCOUNTER — Encounter: Payer: Medicaid Other | Admitting: Obstetrics and Gynecology

## 2016-07-30 ENCOUNTER — Telehealth: Payer: Self-pay

## 2016-07-30 ENCOUNTER — Encounter: Payer: Self-pay | Admitting: Certified Nurse Midwife

## 2016-07-30 ENCOUNTER — Ambulatory Visit (INDEPENDENT_AMBULATORY_CARE_PROVIDER_SITE_OTHER): Payer: Medicaid Other | Admitting: Certified Nurse Midwife

## 2016-07-30 VITALS — BP 128/77 | HR 106 | Temp 98.9°F | Wt 160.0 lb

## 2016-07-30 DIAGNOSIS — J029 Acute pharyngitis, unspecified: Secondary | ICD-10-CM

## 2016-07-30 DIAGNOSIS — Z3402 Encounter for supervision of normal first pregnancy, second trimester: Secondary | ICD-10-CM

## 2016-07-30 DIAGNOSIS — H9201 Otalgia, right ear: Secondary | ICD-10-CM

## 2016-07-30 LAB — POCT URINALYSIS DIPSTICK
Bilirubin, UA: NEGATIVE
Blood, UA: NEGATIVE
Glucose, UA: NEGATIVE
Ketones, UA: NEGATIVE
NITRITE UA: NEGATIVE
PROTEIN UA: NEGATIVE
Spec Grav, UA: 1.005 (ref 1.030–1.035)
Urobilinogen, UA: NEGATIVE (ref ?–2.0)
pH, UA: 7 (ref 5.0–8.0)

## 2016-07-30 LAB — POCT RAPID STREP A (OFFICE): RAPID STREP A SCREEN: NEGATIVE

## 2016-07-30 MED ORDER — CEFDINIR 300 MG PO CAPS: 300.0000 mg | ORAL_CAPSULE | Freq: Two times a day (BID) | ORAL | 0 refills | Status: AC

## 2016-07-30 NOTE — Progress Notes (Signed)
OB w/I- sore throat. h/a, no fevers, slight cough x 48h. Robitussin and cough drops not helping.

## 2016-07-30 NOTE — Progress Notes (Signed)
Subjective:   Lindsay Mcdowell is a 26 y.o. G1P0 [redacted]w[redacted]d being seen today for her obstetrical visit.  She is accompanied by her significant other, Viviann Spare.   Patient reports right ear pain/pressure, sinus pressure, clear runny nose, intermittent dry cough, and sore throat x three (3) days.  She denies relief of symptoms despite home measures.   Denies difficulty breathing or respiratory distress, chest pain, abdominal pain, vaginal bleeding, and leg pain or swelling.   The following portions of the patient's history were reviewed and updated as appropriate: allergies, current medications, past family history, past medical history, past social history, past surgical history and problem list.   Objective:  BP 128/77   Pulse (!) 106   Temp 98.9 F (37.2 C)   Wt 160 lb (72.6 kg)   LMP 04/11/2016 (Exact Date)   BMI 27.46 kg/m   HENT: Sinuses non-tender to touch, opaque. Right ear with bulging tympanic membrane and reddish brown drainage.   RESPIRATORY: CTAB  CARDIAC: RRR   Results for orders placed or performed in visit on 07/30/16 (from the past 24 hour(s))  POCT rapid strep A     Status: None   Collection Time: 07/30/16 11:37 AM  Result Value Ref Range   Rapid Strep A Screen Negative Negative  POCT urinalysis dipstick     Status: Abnormal   Collection Time: 07/30/16 12:30 PM  Result Value Ref Range   Color, UA yellow    Clarity, UA clear    Glucose, UA neg    Bilirubin, UA neg    Ketones, UA neg    Spec Grav, UA 1.005 1.030 - 1.035   Blood, UA neg    pH, UA 7.0 5.0 - 8.0   Protein, UA neg    Urobilinogen, UA negative Negative - 2.0   Nitrite, UA neg    Leukocytes, UA Trace (A) Negative    Assessment:   1. Sore throat - POCT rapid strep A  2. Encounter for supervision of normal first pregnancy in second trimester - POCT urinalysis dipstick - US OB Comp + 14 Wk; Future  3. Right ear pain  Plan:   Encourage symptoms management with OTC medications, updated list given.    Rx Omnicef, see orders.   Work note given excusing patient until Monday, August 02, 2016. Sent via MyChart.   Reviewed red flag symptoms and when to call.  RTC x 4 weeks for anatomy scan and ROB.   Gunnar Bulla, CNM

## 2016-07-30 NOTE — Telephone Encounter (Signed)
Pt's husband had called earlier stated that pt was in bed sick with cold and cough. I called pt and she did sound "stopped" up.  C/O sore throat (not from coughing); ears hurt, headache, bloody nose (did sleep with humidifier last night), non-productive cough and unsure if has fever. Has taken Robitussin cough and chest with no results, cough drops.  This start on Wednesday am and has progressively gotten worse. Pt will come in for appt at 11:00 am today.

## 2016-07-30 NOTE — Patient Instructions (Signed)
Minor Illnesses and Medications in Pregnancy  Cold/Flu:  Sudafed for congestion- Robitussin (plain) for cough- Tylenol for discomfort.  Please follow the directions on the label.  Try not to take any more than needed.  OTC Saline nasal spray and air humidifier or cool-mist  Vaporizer to sooth nasal irritation and to loosen congestion.  It is also important to increase intake of non carbonated fluids, especially if you have a fever.  Constipation:  Colace-2 capsules at bedtime; Metamucil- follow directions on label; Senokot- 1 tablet at bedtime.  Any one of these medications can be used.  It is also very important to increase fluids and fruits along with regular exercise.  If problem persists please call the office.  Diarrhea:  Kaopectate as directed on the label.  Eat a bland diet and increase fluids.  Avoid highly seasoned foods.  Headache:  Tylenol 1 or 2 tablets every 3-4 hours as needed  Indigestion:  Maalox, Mylanta, Tums or Rolaids- as directed on label.  Also try to eat small meals and avoid fatty, greasy or spicy foods.  Nausea with or without Vomiting:  Nausea in pregnancy is caused by increased levels of hormones in the body which influence the digestive system and cause irritation when stomach acids accumulate.  Symptoms usually subside after 1st trimester of pregnancy.  Try the following: 1. Keep saltines, graham crackers or dry toast by your bed to eat upon awakening. 2. Don't let your stomach get empty.  Try to eat 5-6 small meals per day instead of 3 large ones. 3. Avoid greasy fatty or highly seasoned foods.  4. Take OTC Unisom 1 tablet at bed time along with OTC Vitamin B6 25-50 mg 3 times per day.    If nausea continues with vomiting and you are unable to keep down food and fluids you may need a prescription medication.  Please notify your provider.   Sore throat:  Chloraseptic spray, throat lozenges and or plain Tylenol.  Vaginal Yeast Infection:  OTC Monistat for 7 days as  directed on label.  If symptoms do not resolve within a week notify provider.  If any of the above problems do not subside with recommended treatment please call the office for further assistance.   Do not take Aspirin, Advil, Motrin or Ibuprofen.  * * OTC= Over the counter  

## 2016-08-03 ENCOUNTER — Encounter: Payer: Medicaid Other | Admitting: Certified Nurse Midwife

## 2016-08-18 ENCOUNTER — Encounter: Payer: Self-pay | Admitting: Obstetrics and Gynecology

## 2016-08-18 ENCOUNTER — Ambulatory Visit (INDEPENDENT_AMBULATORY_CARE_PROVIDER_SITE_OTHER): Payer: Medicaid Other | Admitting: Obstetrics and Gynecology

## 2016-08-18 VITALS — BP 118/74 | HR 90 | Wt 164.5 lb

## 2016-08-18 DIAGNOSIS — B3731 Acute candidiasis of vulva and vagina: Secondary | ICD-10-CM

## 2016-08-18 DIAGNOSIS — R87612 Low grade squamous intraepithelial lesion on cytologic smear of cervix (LGSIL): Secondary | ICD-10-CM | POA: Diagnosis not present

## 2016-08-18 DIAGNOSIS — B373 Candidiasis of vulva and vagina: Secondary | ICD-10-CM

## 2016-08-18 MED ORDER — TERCONAZOLE 0.4 % VA CREA: 1.0000 | TOPICAL_CREAM | Freq: Every day | VAGINAL | 2 refills | Status: DC

## 2016-08-18 NOTE — Progress Notes (Signed)
ENCOMPASS COLPOSCOPY PROCEDURE NOTE  26 y.o. G1P0 here for colposcopy for low-grade squamous intraepithelial neoplasia (LGSIL - encompassing HPV,mild dysplasia,CIN I) pap smear . Discussed role for HPV in cervical dysplasia, need for surveillance.  Patient given informed consent, signed copy in the chart, time out was performed.  Placed in lithotomy position. Copious amounts of thick white yeast discharge noted in vagina. Cervix viewed with speculum and colposcope after application of acetic acid.   Colposcopy adequate? No, difficulty due to discharge obscuring view. But  no visible lesions;  Routine preventative health maintenance measures emphasized.  I;LGSIL Yeast infection of vagina [redacted] weeks pregnant  Rx sent in for terazol cream and instructed on use. Will repeat colpo 8 weeks PP.   Melody Suzan Nailer, CNM

## 2016-08-30 ENCOUNTER — Ambulatory Visit (INDEPENDENT_AMBULATORY_CARE_PROVIDER_SITE_OTHER): Payer: Medicaid Other

## 2016-08-30 ENCOUNTER — Ambulatory Visit (INDEPENDENT_AMBULATORY_CARE_PROVIDER_SITE_OTHER): Payer: Medicaid Other | Admitting: Certified Nurse Midwife

## 2016-08-30 ENCOUNTER — Encounter: Payer: Self-pay | Admitting: Certified Nurse Midwife

## 2016-08-30 VITALS — BP 117/69 | HR 81 | Wt 166.2 lb

## 2016-08-30 DIAGNOSIS — Z3490 Encounter for supervision of normal pregnancy, unspecified, unspecified trimester: Secondary | ICD-10-CM | POA: Insufficient documentation

## 2016-08-30 DIAGNOSIS — Z3A21 21 weeks gestation of pregnancy: Secondary | ICD-10-CM

## 2016-08-30 DIAGNOSIS — Z3402 Encounter for supervision of normal first pregnancy, second trimester: Secondary | ICD-10-CM | POA: Diagnosis not present

## 2016-08-30 LAB — POCT URINALYSIS DIPSTICK
Bilirubin, UA: NEGATIVE
Blood, UA: NEGATIVE
Glucose, UA: NEGATIVE
KETONES UA: NEGATIVE
LEUKOCYTES UA: NEGATIVE
NITRITE UA: NEGATIVE
PH UA: 5 (ref 5.0–8.0)
PROTEIN UA: NEGATIVE
Spec Grav, UA: 1.02 (ref 1.010–1.025)
UROBILINOGEN UA: 0.2 U/dL

## 2016-08-30 NOTE — Addendum Note (Signed)
Addended by: Brooke DareSICK, Megumi Treaster L on: 08/30/2016 11:43 AM   Modules accepted: Orders

## 2016-08-30 NOTE — Progress Notes (Signed)
ROB

## 2016-08-30 NOTE — Patient Instructions (Signed)

## 2016-08-30 NOTE — Progress Notes (Signed)
ROB, doing well. Ultrasound today normal, see below. Discussed the completion of breast feeding form, she will bring it in at her next appointment. Reviewed glucose testing to be completed at 28 wk visit. PTL precautions reviewed. Follow up in 4 wks.   Doreene BurkeAnnie Maisey Deandrade, CNM   ULTRASOUND REPORT  Location: [facility] Date of Service: 08/30/16  Indications:Anatomy U/S Findings:  Mason JimSingleton intrauterine pregnancy is visualized with FHR at 150 BPM. Biometrics give an (U/S) Gestational age of 27 4/7 weeks and an (U/S) EDD of 01/06/17; this correlates with the clinically established EDD of 01/08/17.  Fetal presentation is Vertex.  EFW: 433g (15 oz) . Placenta: Anterior, grade 0, 7.4 cm from internal os, central cord insert. AFI: Adequate with MVP of 6.5 cm.  Anatomic survey is complete and normal; Gender - female  .   Ovaries are not visualized Survey of the adnexa demonstrates no adnexal masses. There is no free peritoneal fluid in the cul de sac.  Impression: 1. 21 4/7 week Viable Singleton Intrauterine pregnancy by U/S. 2. (U/S) EDD is consistent with Clinically established (LMP) EDD of 01/08/17. 3. Normal Anatomy Scan  Recommendations: 1.Clinical correlation with the patient's History and Physical Exam.   Boyce MediciMaria E Hill

## 2016-09-24 ENCOUNTER — Telehealth: Payer: Self-pay | Admitting: Certified Nurse Midwife

## 2016-09-24 NOTE — Telephone Encounter (Signed)
Patient lvm wanting to reschedule to an earlier time, I called the patient and lvm, letting her know that she can call back to move that appointment.

## 2016-09-27 ENCOUNTER — Encounter: Payer: Medicaid Other | Admitting: Certified Nurse Midwife

## 2016-09-27 ENCOUNTER — Ambulatory Visit (INDEPENDENT_AMBULATORY_CARE_PROVIDER_SITE_OTHER): Payer: Medicaid Other | Admitting: Certified Nurse Midwife

## 2016-09-27 VITALS — BP 133/83 | HR 101 | Wt 167.5 lb

## 2016-09-27 DIAGNOSIS — Z3402 Encounter for supervision of normal first pregnancy, second trimester: Secondary | ICD-10-CM

## 2016-09-27 DIAGNOSIS — Z130 Encounter for screening for diseases of the blood and blood-forming organs and certain disorders involving the immune mechanism: Secondary | ICD-10-CM

## 2016-09-27 DIAGNOSIS — Z131 Encounter for screening for diabetes mellitus: Secondary | ICD-10-CM

## 2016-09-27 NOTE — Progress Notes (Signed)
ROB-Pt feeling better, had stomach bug last Tuesday through Friday. Anticipatory guidance regarding 28 week visit including TDaP vaccine, anemia and gestational diabetes screening, and childbirth classes. [facility] Discussed comfort measures for summer pregnancy. Reviewed red flag symptoms and when to call. RTC x 3 weeks for 28 week labs and ROB with Melody.

## 2016-09-27 NOTE — Patient Instructions (Addendum)
Round Ligament Pain The round ligament is a cord of muscle and tissue that helps to support the uterus. It can become a source of pain during pregnancy if it becomes stretched or twisted as the baby grows. The pain usually begins in the second trimester of pregnancy, and it can come and go until the baby is delivered. It is not a serious problem, and it does not cause harm to the baby. Round ligament pain is usually a short, sharp, and pinching pain, but it can also be a dull, lingering, and aching pain. The pain is felt in the lower side of the abdomen or in the groin. It usually starts deep in the groin and moves up to the outside of the hip area. Pain can occur with:  A sudden change in position.  Rolling over in bed.  Coughing or sneezing.  Physical activity.  Follow these instructions at home: Watch your condition for any changes. Take these steps to help with your pain:  When the pain starts, relax. Then try: ? Sitting down. ? Flexing your knees up to your abdomen. ? Lying on your side with one pillow under your abdomen and another pillow between your legs. ? Sitting in a warm bath for 15-20 minutes or until the pain goes away.  Take over-the-counter and prescription medicines only as told by your health care provider.  Move slowly when you sit and stand.  Avoid long walks if they cause pain.  Stop or lessen your physical activities if they cause pain.  Contact a health care provider if:  Your pain does not go away with treatment.  You feel pain in your back that you did not have before.  Your medicine is not helping. Get help right away if:  You develop a fever or chills.  You develop uterine contractions.  You develop vaginal bleeding.  You develop nausea or vomiting.  You develop diarrhea.  You have pain when you urinate. This information is not intended to replace advice given to you by your health care provider. Make sure you discuss any questions you have  with your health care provider. Document Released: 01/20/2008 Document Revised: 09/18/2015 Document Reviewed: 06/19/2014 Elsevier Interactive Patient Education  2018 [place] of Pregnancy The second trimester is from week 13 through week 28, month 4 through 6. This is often the time in pregnancy that you feel your best. Often times, morning sickness has lessened or quit. You may have more energy, and you may get hungry more often. Your unborn baby (fetus) is growing rapidly. At the end of the sixth month, he or she is about 9 inches long and weighs about 1 pounds. You will likely feel the baby move (quickening) between 18 and 20 weeks of pregnancy. Follow these instructions at home:  Avoid all smoking, herbs, and alcohol. Avoid drugs not approved by your doctor.  Do not use any tobacco products, including cigarettes, chewing tobacco, and electronic cigarettes. If you need help quitting, ask your doctor. You may get counseling or other support to help you quit.  Only take medicine as told by your doctor. Some medicines are safe and some are not during pregnancy.  Exercise only as told by your doctor. Stop exercising if you start having cramps.  Eat regular, healthy meals.  Wear a good support bra if your breasts are tender.  Do not use hot tubs, steam rooms, or saunas.  Wear your seat belt when driving.  Avoid raw meat, uncooked  cheese, and liter boxes and soil used by cats.  Take your prenatal vitamins.  Take 1500-2000 milligrams of calcium daily starting at the 20th week of pregnancy until you deliver your baby.  Try taking medicine that helps you poop (stool softener) as needed, and if your doctor approves. Eat more fiber by eating fresh fruit, vegetables, and whole grains. Drink enough fluids to keep your pee (urine) clear or pale yellow.  Take warm water baths (sitz baths) to soothe pain or discomfort caused by hemorrhoids. Use hemorrhoid cream if your  doctor approves.  If you have puffy, bulging veins (varicose veins), wear support hose. Raise (elevate) your feet for 15 minutes, 3-4 times a day. Limit salt in your diet.  Avoid heavy lifting, wear low heals, and sit up straight.  Rest with your legs raised if you have leg cramps or low back pain.  Visit your dentist if you have not gone during your pregnancy. Use a soft toothbrush to brush your teeth. Be gentle when you floss.  You can have sex (intercourse) unless your doctor tells you not to.  Go to your doctor visits. Get help if:  You feel dizzy.  You have mild cramps or pressure in your lower belly (abdomen).  You have a nagging pain in your belly area.  You continue to feel sick to your stomach (nauseous), throw up (vomit), or have watery poop (diarrhea).  You have bad smelling fluid coming from your vagina.  You have pain with peeing (urination). Get help right away if:  You have a fever.  You are leaking fluid from your vagina.  You have spotting or bleeding from your vagina.  You have severe belly cramping or pain.  You lose or gain weight rapidly.  You have trouble catching your breath and have chest pain.  You notice sudden or extreme puffiness (swelling) of your face, hands, ankles, feet, or legs.  You have not felt the baby move in over an hour.  You have severe headaches that do not go away with medicine.  You have vision changes. This information is not intended to replace advice given to you by your health care provider. Make sure you discuss any questions you have with your health care provider. Document Released: 07/07/2009 Document Revised: 09/18/2015 Document Reviewed: 06/13/2012 Elsevier Interactive Patient Education  2017 [place]. Common Medications Safe in Pregnancy  Acne:      Constipation:  Benzoyl Peroxide     Colace  Clindamycin      Dulcolax Suppository  Topica Erythromycin     Fibercon  Salicylic  Acid      Metamucil         Miralax AVOID:        Senakot   Accutane    Cough:  Retin-A       Cough Drops  Tetracycline      Phenergan w/ Codeine if Rx  Minocycline      Robitussin (Plain & DM)  Antibiotics:     Crabs/Lice:  Ceclor       RID  Cephalosporins    AVOID:  E-Mycins      Kwell  Keflex  Macrobid/Macrodantin   Diarrhea:  Penicillin      Kao-Pectate  Zithromax      Imodium AD         PUSH FLUIDS AVOID:       Cipro     Fever:  Tetracycline      Tylenol (Regular or Extra  Minocycline  Strength)  Levaquin      Extra Strength-Do not          Exceed 8 tabs/24 hrs Caffeine:        '200mg'$ /day (equiv. To 1 cup of coffee or  approx. 3 12 oz sodas)         Gas: Cold/Hayfever:       Gas-X  Benadryl      Mylicon  Claritin       Phazyme  **Claritin-D        Chlor-Trimeton    Headaches:  Dimetapp      ASA-Free Excedrin  Drixoral-Non-Drowsy     Cold Compress  Mucinex (Guaifenasin)     Tylenol (Regular or Extra  Sudafed/Sudafed-12 Hour     Strength)  **Sudafed PE Pseudoephedrine   Tylenol Cold & Sinus     Vicks Vapor Rub  Zyrtec  **AVOID if Problems With Blood Pressure         Heartburn: Avoid lying down for at least 1 hour after meals  Aciphex      Maalox     Rash:  Milk of Magnesia     Benadryl    Mylanta       1% Hydrocortisone Cream  Pepcid  Pepcid Complete   Sleep Aids:  Prevacid      Ambien   Prilosec       Benadryl  Rolaids       Chamomile Tea  Tums (Limit 4/day)     Unisom  Zantac       Tylenol PM         Warm milk-add vanilla or  Hemorrhoids:       Sugar for taste  Anusol/Anusol H.C.  (RX: Analapram 2.5%)  Sugar Substitutes:  Hydrocortisone OTC     Ok in moderation  Preparation H      Tucks        Vaseline lotion applied to tissue with wiping    Herpes:     Throat:  Acyclovir      Oragel  Famvir  Valtrex     Vaccines:         Flu Shot Leg Cramps:       *Gardasil  Benadryl      Hepatitis A         Hepatitis B Nasal  Spray:       Pneumovax  Saline Nasal Spray     Polio Booster         Tetanus Nausea:       Tuberculosis test or PPD  Vitamin B6 25 mg TID   AVOID:    Dramamine      *Gardasil  Emetrol       Live Poliovirus  Ginger Root 250 mg QID    MMR (measles, mumps &  High Complex Carbs @ Bedtime    rebella)  Sea Bands-Accupressure    Varicella (Chickenpox)  Unisom 1/2 tab TID     *No known complications           If received before Pain:         Known pregnancy;   Darvocet       Resume series after  Lortab        Delivery  Percocet    Yeast:   Tramadol      Femstat  Tylenol 3      Gyne-lotrimin  Ultram       Monistat  Vicodin           MISC:  All Sunscreens           Hair Coloring/highlights          Insect Repellant's          (Including DEET)         Mystic Tans

## 2016-09-27 NOTE — Progress Notes (Signed)
ROB- pt is doing well 

## 2016-11-08 ENCOUNTER — Ambulatory Visit (INDEPENDENT_AMBULATORY_CARE_PROVIDER_SITE_OTHER): Payer: Medicaid Other | Admitting: Certified Nurse Midwife

## 2016-11-08 VITALS — BP 121/70 | HR 96 | Wt 174.4 lb

## 2016-11-08 DIAGNOSIS — R8299 Other abnormal findings in urine: Secondary | ICD-10-CM

## 2016-11-08 DIAGNOSIS — R82998 Other abnormal findings in urine: Secondary | ICD-10-CM

## 2016-11-08 DIAGNOSIS — Z3403 Encounter for supervision of normal first pregnancy, third trimester: Secondary | ICD-10-CM

## 2016-11-08 LAB — POCT URINALYSIS DIPSTICK
Bilirubin, UA: NEGATIVE
GLUCOSE UA: NEGATIVE
Ketones, UA: NEGATIVE
Nitrite, UA: NEGATIVE
Protein, UA: NEGATIVE
Urobilinogen, UA: 0.2 E.U./dL
pH, UA: 8 (ref 5.0–8.0)

## 2016-11-08 NOTE — Progress Notes (Signed)
ROB, doing well. States that she has had repeat yeast infections. Information on boric Acid given to improve vaginal health. She has not had her GTT test. She states that when she left from her last visit the line to check out was very long so she left without making the appointment. She states that she called several times and was not able to get through so she left messages and never received a call back. Pt instructed to come first thing in am for 1 hr GTT. She will return in 2 wks for ROB. Will follow up in my chart with results of GTT.   Doreene BurkeAnnie Holle Sprick, CNM

## 2016-11-08 NOTE — Patient Instructions (Signed)

## 2016-11-08 NOTE — Addendum Note (Signed)
Addended by: Frankey ShownGLANTON, Montford Barg C on: 11/08/2016 12:01 PM   Modules accepted: Orders

## 2016-11-09 ENCOUNTER — Other Ambulatory Visit: Payer: Medicaid Other

## 2016-11-09 DIAGNOSIS — Z131 Encounter for screening for diabetes mellitus: Secondary | ICD-10-CM

## 2016-11-09 DIAGNOSIS — Z130 Encounter for screening for diseases of the blood and blood-forming organs and certain disorders involving the immune mechanism: Secondary | ICD-10-CM

## 2016-11-10 ENCOUNTER — Other Ambulatory Visit: Payer: Self-pay | Admitting: Certified Nurse Midwife

## 2016-11-10 DIAGNOSIS — Z3A31 31 weeks gestation of pregnancy: Secondary | ICD-10-CM

## 2016-11-10 LAB — CBC WITH DIFFERENTIAL/PLATELET
BASOS ABS: 0 10*3/uL (ref 0.0–0.2)
Basos: 0 %
EOS (ABSOLUTE): 0.1 10*3/uL (ref 0.0–0.4)
EOS: 1 %
HEMATOCRIT: 30.1 % — AB (ref 34.0–46.6)
Hemoglobin: 9.1 g/dL — ABNORMAL LOW (ref 11.1–15.9)
IMMATURE GRANULOCYTES: 1 %
Immature Grans (Abs): 0.1 10*3/uL (ref 0.0–0.1)
Lymphocytes Absolute: 1.6 10*3/uL (ref 0.7–3.1)
Lymphs: 17 %
MCH: 27.4 pg (ref 26.6–33.0)
MCHC: 30.2 g/dL — ABNORMAL LOW (ref 31.5–35.7)
MCV: 91 fL (ref 79–97)
MONOS ABS: 0.5 10*3/uL (ref 0.1–0.9)
Monocytes: 5 %
NEUTROS PCT: 76 %
Neutrophils Absolute: 7.1 10*3/uL — ABNORMAL HIGH (ref 1.4–7.0)
PLATELETS: 328 10*3/uL (ref 150–379)
RBC: 3.32 x10E6/uL — AB (ref 3.77–5.28)
RDW: 12.9 % (ref 12.3–15.4)
WBC: 9.4 10*3/uL (ref 3.4–10.8)

## 2016-11-10 LAB — GLUCOSE, 1 HOUR GESTATIONAL: GESTATIONAL DIABETES SCREEN: 170 mg/dL — AB (ref 65–139)

## 2016-11-10 LAB — URINE CULTURE

## 2016-11-10 NOTE — Progress Notes (Signed)
Elevated 1 hr GTT, pt called and instructed to schedule 3 hr GTT  Lindsay BurkeAnnie Itzell Mcdowell, CNM

## 2016-11-23 ENCOUNTER — Ambulatory Visit (INDEPENDENT_AMBULATORY_CARE_PROVIDER_SITE_OTHER): Payer: Medicaid Other | Admitting: Obstetrics and Gynecology

## 2016-11-23 VITALS — BP 118/74 | HR 96 | Wt 174.1 lb

## 2016-11-23 DIAGNOSIS — R7309 Other abnormal glucose: Secondary | ICD-10-CM | POA: Insufficient documentation

## 2016-11-23 DIAGNOSIS — Z3493 Encounter for supervision of normal pregnancy, unspecified, third trimester: Secondary | ICD-10-CM

## 2016-11-23 DIAGNOSIS — O99013 Anemia complicating pregnancy, third trimester: Secondary | ICD-10-CM

## 2016-11-23 DIAGNOSIS — R7302 Impaired glucose tolerance (oral): Secondary | ICD-10-CM

## 2016-11-23 NOTE — Progress Notes (Signed)
ROB-pt is doing well, hasnt done her 3 hr tt yet is planning on doing it next week

## 2016-11-23 NOTE — Progress Notes (Signed)
ROB-will do 3hGTT ASAP, tolerating iron, discussed BC, circumcision, and classes, started classes already.

## 2016-11-23 NOTE — Patient Instructions (Signed)
Pregnancy and Anemia Anemia is a condition in which the concentration of red blood cells or hemoglobin in the blood is below normal. Hemoglobin is a substance in red blood cells that carries oxygen to the tissues of the body. Anemia results in not enough oxygen reaching these tissues. Anemia during pregnancy is common because the fetus uses more iron and folic acid as it is developing. Your body may not produce enough red blood cells because of this. Also, during pregnancy, the liquid part of the blood (plasma) increases by about 50%, and the red blood cells increase by only 25%. This lowers the concentration of the red blood cells and creates a natural anemia-like situation. What are the causes? The most common cause of anemia during pregnancy is not having enough iron in the body to make red blood cells (iron deficiency anemia). Other causes may include:  Folic acid deficiency.  Vitamin B12 deficiency.  Certain prescription or over-the-counter medicines.  Certain medical conditions or infections that destroy red blood cells.  A low platelet count and bleeding caused by antibodies that go through the placenta to the fetus from the mother's blood. What are the signs or symptoms? Mild anemia may not be noticeable. If it becomes severe, symptoms may include:  Tiredness.  Shortness of breath, especially with exercise.  Weakness.  Fainting.  Pale looking skin.  Headaches.  Feeling a fast or irregular heartbeat (palpitations). How is this diagnosed? The type of anemia is usually diagnosed from your family and medical history and blood tests. How is this treated? Treatment of anemia during pregnancy depends on the cause of the anemia. Treatment can include:  Supplements of iron, vitamin B12, or folic acid.  A blood transfusion. This may be needed if blood loss is severe.  Hospitalization. This may be needed if there is significant continual blood loss.  Dietary changes. Follow  these instructions at home:  Follow your dietitian's or health care provider's dietary recommendations.  Increase your vitamin C intake. This will help the stomach absorb more iron.  Eat a diet rich in iron. This would include foods such as:  Liver.  Beef.  Whole grain bread.  Eggs.  Dried fruit.  Take iron and vitamins as directed by your health care provider.  Eat green leafy vegetables. These are a good source of folic acid. Contact a health care provider if:  You have frequent or lasting headaches.  You are looking pale.  You are bruising easily. Get help right away if:  You have extreme weakness, shortness of breath, or chest pain.  You become dizzy or have trouble concentrating.  You have heavy vaginal bleeding.  You develop a rash.  You have bloody or black, tarry stools.  You faint.  You vomit up blood.  You vomit repeatedly.  You have abdominal pain.  You have a fever or persistent symptoms for more than 2-3 days.  You have a fever and your symptoms suddenly get worse.  You are dehydrated. This information is not intended to replace advice given to you by your health care provider. Make sure you discuss any questions you have with your health care provider. Document Released: 04/09/2000 Document Revised: 09/18/2015 Document Reviewed: 11/22/2012 Elsevier Interactive Patient Education  2017 [employer] Inc.  

## 2016-11-29 ENCOUNTER — Other Ambulatory Visit: Payer: Medicaid Other

## 2016-11-29 ENCOUNTER — Other Ambulatory Visit: Payer: Self-pay | Admitting: Certified Nurse Midwife

## 2016-11-29 DIAGNOSIS — Z369 Encounter for antenatal screening, unspecified: Secondary | ICD-10-CM

## 2016-11-29 DIAGNOSIS — Z3A31 31 weeks gestation of pregnancy: Secondary | ICD-10-CM

## 2016-11-29 DIAGNOSIS — Z3401 Encounter for supervision of normal first pregnancy, first trimester: Secondary | ICD-10-CM

## 2016-11-30 ENCOUNTER — Encounter: Payer: Self-pay | Admitting: Certified Nurse Midwife

## 2016-11-30 LAB — GESTATIONAL GLUCOSE TOLERANCE
GLUCOSE 1 HOUR GTT: 159 mg/dL (ref 65–179)
GLUCOSE 2 HOUR GTT: 126 mg/dL (ref 65–154)
Glucose, Fasting: 81 mg/dL (ref 65–94)
Glucose, GTT - 3 Hour: 82 mg/dL (ref 65–139)

## 2016-12-07 ENCOUNTER — Encounter: Payer: Self-pay | Admitting: Certified Nurse Midwife

## 2016-12-07 ENCOUNTER — Ambulatory Visit (INDEPENDENT_AMBULATORY_CARE_PROVIDER_SITE_OTHER): Payer: Medicaid Other | Admitting: Certified Nurse Midwife

## 2016-12-07 VITALS — BP 119/75 | HR 87 | Wt 176.4 lb

## 2016-12-07 DIAGNOSIS — Z3493 Encounter for supervision of normal pregnancy, unspecified, third trimester: Secondary | ICD-10-CM

## 2016-12-07 LAB — POCT URINALYSIS DIPSTICK
Bilirubin, UA: NEGATIVE
Blood, UA: NEGATIVE
Glucose, UA: NEGATIVE
Ketones, UA: NEGATIVE
Nitrite, UA: NEGATIVE
PROTEIN UA: NEGATIVE
Spec Grav, UA: 1.015 (ref 1.010–1.025)
UROBILINOGEN UA: 0.2 U/dL
pH, UA: 6 (ref 5.0–8.0)

## 2016-12-07 NOTE — Progress Notes (Signed)
ROB, doing well. Has no complaints. Discussed Labor and postpartum course. Reviewed GBS at next visit. Follow up 1 wk.  Doreene BurkeAnnie Jamair Cato, CNM

## 2016-12-07 NOTE — Patient Instructions (Signed)
Group B Streptococcus Infection During Pregnancy Group B Streptococcus (GBS) is a type of bacteria (Streptococcus agalactiae) that is often found in healthy people, commonly in the rectum, vagina, and intestines. In people who are healthy and not pregnant, the bacteria rarely cause serious illness or complications. However, women who test positive for GBS during pregnancy can pass the bacteria to their baby during childbirth, which can cause serious infection in the baby after birth. Women with GBS may also have infections during their pregnancy or immediately after childbirth, such as such as urinary tract infections (UTIs) or infections of the uterus (uterine infections). Having GBS also increases a woman's risk of complications during pregnancy, such as early (preterm) labor or delivery, miscarriage, or stillbirth. Routine testing (screening) for GBS is recommended for all pregnant women. What increases the risk? You may have a higher risk for GBS infection during pregnancy if you had one during a past pregnancy. What are the signs or symptoms? In most cases, GBS infection does not cause symptoms in pregnant women. Signs and symptoms of a possible GBS-related infection may include:  Labor starting before the 37th week of pregnancy.  A UTI or bladder infection, which may cause: ? Fever. ? Pain or burning during urination. ? Frequent urination.  Fever during labor, along with: ? Bad-smelling discharge. ? Uterine tenderness. ? Rapid heartbeat in the mother, baby, or both.  Rare but serious symptoms of a possible GBS-related infection in women include:  Blood infection (septicemia). This may cause fever, chills, or confusion.  Lung infection (pneumonia). This may cause fever, chills, cough, rapid breathing, difficulty breathing, or chest pain.  Bone, joint, skin, or soft tissue infection.  How is this diagnosed? You may be screened for GBS between week 35 and week 37 of your pregnancy. If  you have symptoms of preterm labor, you may be screened earlier. This condition is diagnosed based on lab test results from:  A swab of fluid from the vagina and rectum.  A urine sample.  How is this treated? This condition is treated with antibiotic medicine. When you go into labor, or as soon as your water breaks (your membranes rupture), you will be given antibiotics through an IV tube. Antibiotics will continue until after you give birth. If you are having a cesarean delivery, you do not need antibiotics unless your membranes have already ruptured. Follow these instructions at home:  Take over-the-counter and prescription medicines only as told by your health care provider.  Take your antibiotic medicine as told by your health care provider. Do not stop taking the antibiotic even if you start to feel better.  Keep all pre-birth (prenatal) visits and follow-up visits as told by your health care provider. This is important. Contact a health care provider if:  You have pain or burning when you urinate.  You have to urinate frequently.  You have a fever or chills.  You develop a bad-smelling vaginal discharge. Get help right away if:  Your membranes rupture.  You go into labor.  You have severe pain in your abdomen.  You have difficulty breathing.  You have chest pain. This information is not intended to replace advice given to you by your health care provider. Make sure you discuss any questions you have with your health care provider. Document Released: 07/20/2007 Document Revised: 11/07/2015 Document Reviewed: 11/06/2015 Elsevier Interactive Patient Education  2018 [employer] Inc.  

## 2016-12-14 ENCOUNTER — Ambulatory Visit (INDEPENDENT_AMBULATORY_CARE_PROVIDER_SITE_OTHER): Payer: Medicaid Other | Admitting: Certified Nurse Midwife

## 2016-12-14 ENCOUNTER — Encounter: Payer: Self-pay | Admitting: Certified Nurse Midwife

## 2016-12-14 ENCOUNTER — Encounter: Payer: Medicaid Other | Admitting: Certified Nurse Midwife

## 2016-12-14 VITALS — BP 125/72 | HR 91 | Wt 179.1 lb

## 2016-12-14 DIAGNOSIS — Z3493 Encounter for supervision of normal pregnancy, unspecified, third trimester: Secondary | ICD-10-CM

## 2016-12-14 DIAGNOSIS — O99013 Anemia complicating pregnancy, third trimester: Secondary | ICD-10-CM

## 2016-12-14 DIAGNOSIS — Z113 Encounter for screening for infections with a predominantly sexual mode of transmission: Secondary | ICD-10-CM

## 2016-12-14 DIAGNOSIS — Z369 Encounter for antenatal screening, unspecified: Secondary | ICD-10-CM

## 2016-12-14 LAB — POCT URINALYSIS DIPSTICK
Bilirubin, UA: NEGATIVE
GLUCOSE UA: NEGATIVE
Ketones, UA: NEGATIVE
Nitrite, UA: NEGATIVE
PROTEIN UA: NEGATIVE
RBC UA: NEGATIVE
SPEC GRAV UA: 1.01 (ref 1.010–1.025)
UROBILINOGEN UA: 0.2 U/dL
pH, UA: 6 (ref 5.0–8.0)

## 2016-12-14 NOTE — Progress Notes (Signed)
ROB-Pt doing well. 36 week cultures collected. Declines cervical exam. Reviewed red flag symptoms and when to call. RTC x 1 week or sooner if needed.  

## 2016-12-14 NOTE — Patient Instructions (Signed)
Vaginal Delivery Vaginal delivery means that you will give birth by pushing your baby out of your birth canal (vagina). A team of health care providers will help you before, during, and after vaginal delivery. Birth experiences are unique for every woman and every pregnancy, and birth experiences vary depending on where you choose to give birth. What should I do to prepare for my baby's birth? Before your baby is born, it is important to talk with your health care provider about:  Your labor and delivery preferences. These may include: ? Medicines that you may be given. ? How you will manage your pain. This might include non-medical pain relief techniques or injectable pain relief such as epidural analgesia. ? How you and your baby will be monitored during labor and delivery. ? Who may be in the labor and delivery room with you. ? Your feelings about surgical delivery of your baby (cesarean delivery, or C-section) if this becomes necessary. ? Your feelings about receiving donated blood through an IV tube (blood transfusion) if this becomes necessary.  Whether you are able: ? To take pictures or videos of the birth. ? To eat during labor and delivery. ? To move around, walk, or change positions during labor and delivery.  What to expect after your baby is born, such as: ? Whether delayed umbilical cord clamping and cutting is offered. ? Who will care for your baby right after birth. ? Medicines or tests that may be recommended for your baby. ? Whether breastfeeding is supported in your hospital or birth center. ? How long you will be in the hospital or birth center.  How any medical conditions you have may affect your baby or your labor and delivery experience.  To prepare for your baby's birth, you should also:  Attend all of your health care visits before delivery (prenatal visits) as recommended by your health care provider. This is important.  Prepare your home for your baby's  arrival. Make sure that you have: ? Diapers. ? Baby clothing. ? Feeding equipment. ? Safe sleeping arrangements for you and your baby.  Install a car seat in your vehicle. Have your car seat checked by a certified car seat installer to make sure that it is installed safely.  Think about who will help you with your new baby at home for at least the first several weeks after delivery.  What can I expect when I arrive at the birth center or hospital? Once you are in labor and have been admitted into the hospital or birth center, your health care provider may:  Review your pregnancy history and any concerns you have.  Insert an IV tube into one of your veins. This is used to give you fluids and medicines.  Check your blood pressure, pulse, temperature, and heart rate (vital signs).  Check whether your bag of water (amniotic sac) has broken (ruptured).  Talk with you about your birth plan and discuss pain control options.  Monitoring Your health care provider may monitor your contractions (uterine monitoring) and your baby's heart rate (fetal monitoring). You may need to be monitored:  Often, but not continuously (intermittently).  All the time or for long periods at a time (continuously). Continuous monitoring may be needed if: ? You are taking certain medicines, such as medicine to relieve pain or make your contractions stronger. ? You have pregnancy or labor complications.  Monitoring may be done by:  Placing a special stethoscope or a handheld monitoring device on your abdomen to   check your baby's heartbeat, and feeling your abdomen for contractions. This method of monitoring does not continuously record your baby's heartbeat or your contractions.  Placing monitors on your abdomen (external monitors) to record your baby's heartbeat and the frequency and length of contractions. You may not have to wear external monitors all the time.  Placing monitors inside of your uterus  (internal monitors) to record your baby's heartbeat and the frequency, length, and strength of your contractions. ? Your health care provider may use internal monitors if he or she needs more information about the strength of your contractions or your baby's heart rate. ? Internal monitors are put in place by passing a thin, flexible wire through your vagina and into your uterus. Depending on the type of monitor, it may remain in your uterus or on your baby's head until birth. ? Your health care provider will discuss the benefits and risks of internal monitoring with you and will ask for your permission before inserting the monitors.  Telemetry. This is a type of continuous monitoring that can be done with external or internal monitors. Instead of having to stay in bed, you are able to move around during telemetry. Ask your health care provider if telemetry is an option for you.  Physical exam Your health care provider may perform a physical exam. This may include:  Checking whether your baby is positioned: ? With the head toward your vagina (head-down). This is most common. ? With the head toward the top of your uterus (head-up or breech). If your baby is in a breech position, your health care provider may try to turn your baby to a head-down position so you can deliver vaginally. If it does not seem that your baby can be born vaginally, your provider may recommend surgery to deliver your baby. In rare cases, you may be able to deliver vaginally if your baby is head-up (breech delivery). ? Lying sideways (transverse). Babies that are lying sideways cannot be delivered vaginally.  Checking your cervix to determine: ? Whether it is thinning out (effacing). ? Whether it is opening up (dilating). ? How low your baby has moved into your birth canal.  What are the three stages of labor and delivery?  Normal labor and delivery is divided into the following three stages: Stage 1  Stage 1 is the  longest stage of labor, and it can last for hours or days. Stage 1 includes: ? Early labor. This is when contractions may be irregular, or regular and mild. Generally, early labor contractions are more than 10 minutes apart. ? Active labor. This is when contractions get longer, more regular, more frequent, and more intense. ? The transition phase. This is when contractions happen very close together, are very intense, and may last longer than during any other part of labor.  Contractions generally feel mild, infrequent, and irregular at first. They get stronger, more frequent (about every 2-3 minutes), and more regular as you progress from early labor through active labor and transition.  Many women progress through stage 1 naturally, but you may need help to continue making progress. If this happens, your health care provider may talk with you about: ? Rupturing your amniotic sac if it has not ruptured yet. ? Giving you medicine to help make your contractions stronger and more frequent.  Stage 1 ends when your cervix is completely dilated to 4 inches (10 cm) and completely effaced. This happens at the end of the transition phase. Stage 2  Once   your cervix is completely effaced and dilated to 4 inches (10 cm), you may start to feel an urge to push. It is common for the body to naturally take a rest before feeling the urge to push, especially if you received an epidural or certain other pain medicines. This rest period may last for up to 1-2 hours, depending on your unique labor experience.  During stage 2, contractions are generally less painful, because pushing helps relieve contraction pain. Instead of contraction pain, you may feel stretching and burning pain, especially when the widest part of your baby's head passes through the vaginal opening (crowning).  Your health care provider will closely monitor your pushing progress and your baby's progress through the vagina during stage 2.  Your  health care provider may massage the area of skin between your vaginal opening and anus (perineum) or apply warm compresses to your perineum. This helps it stretch as the baby's head starts to crown, which can help prevent perineal tearing. ? In some cases, an incision may be made in your perineum (episiotomy) to allow the baby to pass through the vaginal opening. An episiotomy helps to make the opening of the vagina larger to allow more room for the baby to fit through.  It is very important to breathe and focus so your health care provider can control the delivery of your baby's head. Your health care provider may have you decrease the intensity of your pushing, to help prevent perineal tearing.  After delivery of your baby's head, the shoulders and the rest of the body generally deliver very quickly and without difficulty.  Once your baby is delivered, the umbilical cord may be cut right away, or this may be delayed for 1-2 minutes, depending on your baby's health. This may vary among health care providers, hospitals, and birth centers.  If you and your baby are healthy enough, your baby may be placed on your chest or abdomen to help maintain the baby's temperature and to help you bond with each other. Some mothers and babies start breastfeeding at this time. Your health care team will dry your baby and help keep your baby warm during this time.  Your baby may need immediate care if he or she: ? Showed signs of distress during labor. ? Has a medical condition. ? Was born too early (prematurely). ? Had a bowel movement before birth (meconium). ? Shows signs of difficulty transitioning from being inside the uterus to being outside of the uterus. If you are planning to breastfeed, your health care team will help you begin a feeding. Stage 3  The third stage of labor starts immediately after the birth of your baby and ends after you deliver the placenta. The placenta is an organ that develops  during pregnancy to provide oxygen and nutrients to your baby in the womb.  Delivering the placenta may require some pushing, and you may have mild contractions. Breastfeeding can stimulate contractions to help you deliver the placenta.  After the placenta is delivered, your uterus should tighten (contract) and become firm. This helps to stop bleeding in your uterus. To help your uterus contract and to control bleeding, your health care provider may: ? Give you medicine by injection, through an IV tube, by mouth, or through your rectum (rectally). ? Massage your abdomen or perform a vaginal exam to remove any blood clots that are left in your uterus. ? Empty your bladder by placing a thin, flexible tube (catheter) into your bladder. ? Encourage   you to breastfeed your baby. After labor is over, you and your baby will be monitored closely to ensure that you are both healthy until you are ready to go home. Your health care team will teach you how to care for yourself and your baby. This information is not intended to replace advice given to you by your health care provider. Make sure you discuss any questions you have with your health care provider. Document Released: 01/20/2008 Document Revised: 10/31/2015 Document Reviewed: 04/27/2015 Elsevier Interactive Patient Education  2018 [employer]  

## 2016-12-15 LAB — CBC
HEMATOCRIT: 31.9 % — AB (ref 34.0–46.6)
Hemoglobin: 9.8 g/dL — ABNORMAL LOW (ref 11.1–15.9)
MCH: 26.1 pg — AB (ref 26.6–33.0)
MCHC: 30.7 g/dL — AB (ref 31.5–35.7)
MCV: 85 fL (ref 79–97)
Platelets: 310 10*3/uL (ref 150–379)
RBC: 3.76 x10E6/uL — ABNORMAL LOW (ref 3.77–5.28)
RDW: 15 % (ref 12.3–15.4)
WBC: 9.5 10*3/uL (ref 3.4–10.8)

## 2016-12-16 LAB — STREP GP B NAA: STREP GROUP B AG: NEGATIVE

## 2016-12-17 LAB — GC/CHLAMYDIA PROBE AMP
Chlamydia trachomatis, NAA: NEGATIVE
Neisseria gonorrhoeae by PCR: NEGATIVE

## 2016-12-19 NOTE — Progress Notes (Signed)
ROB-Pt doing well. 36 week cultures collected. Declines cervical exam. Reviewed red flag symptoms and when to call. RTC x 1 week or sooner if needed.

## 2016-12-20 ENCOUNTER — Other Ambulatory Visit: Payer: Self-pay | Admitting: Certified Nurse Midwife

## 2016-12-20 DIAGNOSIS — O99013 Anemia complicating pregnancy, third trimester: Secondary | ICD-10-CM

## 2016-12-21 ENCOUNTER — Ambulatory Visit (INDEPENDENT_AMBULATORY_CARE_PROVIDER_SITE_OTHER): Payer: Medicaid Other | Admitting: Certified Nurse Midwife

## 2016-12-21 ENCOUNTER — Encounter: Payer: Self-pay | Admitting: Certified Nurse Midwife

## 2016-12-21 VITALS — BP 130/80 | HR 89 | Wt 180.0 lb

## 2016-12-21 DIAGNOSIS — Z3493 Encounter for supervision of normal pregnancy, unspecified, third trimester: Secondary | ICD-10-CM | POA: Diagnosis not present

## 2016-12-21 LAB — POCT URINALYSIS DIPSTICK
BILIRUBIN UA: NEGATIVE
Blood, UA: NEGATIVE
Glucose, UA: NEGATIVE
KETONES UA: NEGATIVE
Nitrite, UA: NEGATIVE
PH UA: 6 (ref 5.0–8.0)
PROTEIN UA: NEGATIVE
SPEC GRAV UA: 1.01 (ref 1.010–1.025)
Urobilinogen, UA: 0.2 E.U./dL

## 2016-12-21 NOTE — Progress Notes (Signed)
ROB doing well. Discussed H&H, reviewed plan of care. She will have folate, B 12, and iron today. Discussed taking iron supplement with vitamin C. She agrees to plan. On ascultation to FHT, elevated heart rate. Pt state that the baby has been moving. Will do NST to evaluate further.  SVE per Pt request.   NST: Reactive, category 1 Baseline: 135 Variability: moderate Accelerations: present Decelerations: absent Contractions: irritability   Will follow up with lab results. Return in 1 wk.   [name], CNM

## 2016-12-21 NOTE — Patient Instructions (Signed)

## 2016-12-22 ENCOUNTER — Encounter: Payer: Self-pay | Admitting: Certified Nurse Midwife

## 2016-12-22 LAB — FERRITIN: Ferritin: 9 ng/mL — ABNORMAL LOW (ref 15–150)

## 2016-12-28 ENCOUNTER — Ambulatory Visit (INDEPENDENT_AMBULATORY_CARE_PROVIDER_SITE_OTHER): Payer: Medicaid Other | Admitting: Obstetrics and Gynecology

## 2016-12-28 ENCOUNTER — Other Ambulatory Visit: Payer: Self-pay

## 2016-12-28 VITALS — BP 123/76 | HR 82 | Wt 181.5 lb

## 2016-12-28 DIAGNOSIS — D508 Other iron deficiency anemias: Secondary | ICD-10-CM

## 2016-12-28 DIAGNOSIS — Z3493 Encounter for supervision of normal pregnancy, unspecified, third trimester: Secondary | ICD-10-CM

## 2016-12-28 DIAGNOSIS — O99013 Anemia complicating pregnancy, third trimester: Secondary | ICD-10-CM

## 2016-12-28 NOTE — Progress Notes (Signed)
ROB- pt is having pelvic pressure, some contractions 

## 2016-12-28 NOTE — Addendum Note (Signed)
Addended by: Lessie DingsLONTZ, AMY L on: 12/28/2016 11:07 AM   Modules accepted: Orders

## 2016-12-28 NOTE — Progress Notes (Signed)
ROB-labs reviewed, samples fusion plus given and instructed on use, labor precautions discussed.

## 2016-12-29 LAB — FOLATE: Folate: >20 ng/mL (ref >3.0)

## 2016-12-29 LAB — VITAMIN B12: Vitamin B-12: 374 pg/mL (ref 232–1245)

## 2017-01-04 ENCOUNTER — Ambulatory Visit (INDEPENDENT_AMBULATORY_CARE_PROVIDER_SITE_OTHER): Payer: Medicaid Other | Admitting: Certified Nurse Midwife

## 2017-01-04 VITALS — BP 127/84 | HR 88 | Wt 183.1 lb

## 2017-01-04 DIAGNOSIS — Z3A40 40 weeks gestation of pregnancy: Secondary | ICD-10-CM

## 2017-01-04 NOTE — Progress Notes (Signed)
ROB, doing well. Discussed labor precautions and induction at 41 wks. SVE 1-2 cm posterior . She will return in one wk for BPP and ROB.   Doreene BurkeAnnie Carla Rashad, CNM

## 2017-01-04 NOTE — Patient Instructions (Signed)

## 2017-01-05 ENCOUNTER — Other Ambulatory Visit: Payer: Self-pay | Admitting: Certified Nurse Midwife

## 2017-01-05 DIAGNOSIS — O480 Post-term pregnancy: Secondary | ICD-10-CM

## 2017-01-11 ENCOUNTER — Ambulatory Visit (INDEPENDENT_AMBULATORY_CARE_PROVIDER_SITE_OTHER): Payer: Medicaid Other

## 2017-01-11 ENCOUNTER — Telehealth: Payer: Self-pay | Admitting: Certified Nurse Midwife

## 2017-01-11 ENCOUNTER — Ambulatory Visit (INDEPENDENT_AMBULATORY_CARE_PROVIDER_SITE_OTHER): Payer: Medicaid Other | Admitting: Certified Nurse Midwife

## 2017-01-11 VITALS — BP 129/80 | HR 91 | Wt 184.9 lb

## 2017-01-11 DIAGNOSIS — O480 Post-term pregnancy: Secondary | ICD-10-CM | POA: Diagnosis not present

## 2017-01-11 DIAGNOSIS — Z3403 Encounter for supervision of normal first pregnancy, third trimester: Secondary | ICD-10-CM

## 2017-01-11 NOTE — Progress Notes (Signed)
ROB with BPP 8 out of 8. Discuss induction . SVE 2cm posterior firm. AFI of 5.2.  Plan for induction . Scheduled for Thursday @ 8 pm.   [name], CNM ULTRASOUND REPORT  Location: [facility] Date of Service: 01/11/17  Indications:Post dates growth and BPP Findings:  Lindsay Mcdowell intrauterine pregnancy is visualized with FHR at 131 BPM. Biometrics give an (U/S) Gestational age of [redacted] weeks and an (U/S) EDD of 01/11/17; this correlates with the clinically established EDD of 01/08/17.  Fetal presentation is Vertex.  EFW: 3972 g (8lb 12oz) [name] 79 percentile. Placenta: Anterior, grade 3, remote to cervix. AFI: 5.2 cm (oligohydramnios) .2.5th percentile for [redacted] week gestation is 6.3 cm  Fetal stomach and bladder are seen.  Fetal breathing, movement, tone and AFI of > 2cm are all visualized.  BPP score is 8 of 8.  Impression: 1. 40 week Viable Singleton Intrauterine pregnancy by U/S. 2. (U/S) EDD is consistent with Clinically established (LMP) EDD of 01/08/17. 3. Adequate growth 4. Oligohydramnios. 5. 8 of 8 BPP  Recommendations: 1.Clinical correlation with the patient's History and Physical Exam.   Lindsay Mcdowell

## 2017-01-11 NOTE — Patient Instructions (Signed)
Labor Induction Labor induction is when steps are taken to cause a pregnant woman to begin the labor process. Most women go into labor on their own between 37 weeks and 42 weeks of the pregnancy. When this does not happen or when there is a medical need, methods may be used to induce labor. Labor induction causes a pregnant woman's uterus to contract. It also causes the cervix to soften (ripen), open (dilate), and thin out (efface). Usually, labor is not induced before 39 weeks of the pregnancy unless there is a problem with the baby or mother. Before inducing labor, your health care provider will consider a number of factors, including the following:  The medical condition of you and the baby.  How many weeks along you are.  The status of the baby's lung maturity.  The condition of the cervix.  The position of the baby. What are the reasons for labor induction? Labor may be induced for the following reasons:  The health of the baby or mother is at risk.  The pregnancy is overdue by 1 week or more.  The water breaks but labor does not start on its own.  The mother has a health condition or serious illness, such as high blood pressure, infection, placental abruption, or diabetes.  The amniotic fluid amounts are low around the baby.  The baby is distressed. Convenience or wanting the baby to be born on a certain date is not a reason for inducing labor. What methods are used for labor induction? Several methods of labor induction may be used, such as:  Prostaglandin medicine. This medicine causes the cervix to dilate and ripen. The medicine will also start contractions. It can be taken by mouth or by inserting a suppository into the vagina.  Inserting a thin tube (catheter) with a balloon on the end into the vagina to dilate the cervix. Once inserted, the balloon is expanded with water, which causes the cervix to open.  Stripping the membranes. Your health care provider separates  amniotic sac tissue from the cervix, causing the cervix to be stretched and causing the release of a hormone called progesterone. This may cause the uterus to contract. It is often done during an office visit. You will be sent home to wait for the contractions to begin. You will then come in for an induction.  Breaking the water. Your health care provider makes a hole in the amniotic sac using a small instrument. Once the amniotic sac breaks, contractions should begin. This may still take hours to see an effect.  Medicine to trigger or strengthen contractions. This medicine is given through an IV access tube inserted into a vein in your arm. All of the methods of induction, besides stripping the membranes, will be done in the hospital. Induction is done in the hospital so that you and the baby can be carefully monitored. How long does it take for labor to be induced? Some inductions can take up to 2-3 days. Depending on the cervix, it usually takes less time. It takes longer when you are induced early in the pregnancy or if this is your first pregnancy. If a mother is still pregnant and the induction has been going on for 2-3 days, either the mother will be sent home or a cesarean delivery will be needed. What are the risks associated with labor induction? Some of the risks of induction include:  Changes in fetal heart rate, such as too high, too low, or erratic.  Fetal distress.    Chance of infection for the mother and baby.  Increased chance of having a cesarean delivery.  Breaking off (abruption) of the placenta from the uterus (rare).  Uterine rupture (very rare). When induction is needed for medical reasons, the benefits of induction may outweigh the risks. What are some reasons for not inducing labor? Labor induction should not be done if:  It is shown that your baby does not tolerate labor.  You have had previous surgeries on your uterus, such as a myomectomy or the removal of  fibroids.  Your placenta lies very low in the uterus and blocks the opening of the cervix (placenta previa).  Your baby is not in a head-down position.  The umbilical cord drops down into the birth canal in front of the baby. This could cut off the baby's blood and oxygen supply.  You have had a previous cesarean delivery.  There are unusual circumstances, such as the baby being extremely premature. This information is not intended to replace advice given to you by your health care provider. Make sure you discuss any questions you have with your health care provider. Document Released: 09/01/2006 Document Revised: 09/18/2015 Document Reviewed: 11/09/2012 Elsevier Interactive Patient Education  2017 [employer]  

## 2017-01-11 NOTE — Telephone Encounter (Signed)
Pt notified of scheduled induction date and time.

## 2017-01-12 ENCOUNTER — Telehealth: Payer: Self-pay | Admitting: Certified Nurse Midwife

## 2017-01-12 NOTE — Telephone Encounter (Signed)
Patient called and stated that she would like to speak with [name] or a nurse in regards to her being induced and future scheduling soon. Patient did not disclose any other information. Please advise.

## 2017-01-13 ENCOUNTER — Inpatient Hospital Stay: Payer: Medicaid Other | Admitting: Anesthesiology

## 2017-01-13 ENCOUNTER — Inpatient Hospital Stay
Admission: EM | Admit: 2017-01-13 | Discharge: 2017-01-18 | DRG: 765 | Disposition: A | Discharge status: Home / Self Care | Payer: Medicaid Other | Attending: Certified Nurse Midwife | Admitting: Certified Nurse Midwife

## 2017-01-13 ENCOUNTER — Encounter: Payer: Self-pay | Admitting: *Deleted

## 2017-01-13 ENCOUNTER — Other Ambulatory Visit: Payer: Self-pay | Admitting: Certified Nurse Midwife

## 2017-01-13 DIAGNOSIS — Z3A40 40 weeks gestation of pregnancy: Secondary | ICD-10-CM | POA: Diagnosis not present

## 2017-01-13 DIAGNOSIS — O9902 Anemia complicating childbirth: Secondary | ICD-10-CM | POA: Diagnosis present

## 2017-01-13 DIAGNOSIS — O324XX0 Maternal care for high head at term, not applicable or unspecified: Secondary | ICD-10-CM | POA: Diagnosis present

## 2017-01-13 DIAGNOSIS — D649 Anemia, unspecified: Secondary | ICD-10-CM | POA: Diagnosis present

## 2017-01-13 DIAGNOSIS — O26893 Other specified pregnancy related conditions, third trimester: Secondary | ICD-10-CM | POA: Diagnosis present

## 2017-01-13 DIAGNOSIS — Z98891 History of uterine scar from previous surgery: Secondary | ICD-10-CM

## 2017-01-13 DIAGNOSIS — O480 Post-term pregnancy: Principal | ICD-10-CM | POA: Diagnosis present

## 2017-01-13 DIAGNOSIS — O411230 Chorioamnionitis, third trimester, not applicable or unspecified: Secondary | ICD-10-CM | POA: Diagnosis present

## 2017-01-13 LAB — CBC
HCT: 33.4 % — ABNORMAL LOW (ref 35.0–47.0)
HEMOGLOBIN: 11 g/dL — AB (ref 12.0–16.0)
MCH: 27.8 pg (ref 26.0–34.0)
MCHC: 33 g/dL (ref 32.0–36.0)
MCV: 84.3 fL (ref 80.0–100.0)
PLATELETS: 245 10*3/uL (ref 150–440)
RBC: 3.96 MIL/uL (ref 3.80–5.20)
RDW: 19.8 % — AB (ref 11.5–14.5)
WBC: 12.5 10*3/uL — ABNORMAL HIGH (ref 3.6–11.0)

## 2017-01-13 LAB — TYPE AND SCREEN
ABO/RH(D): B POS
ANTIBODY SCREEN: NEGATIVE

## 2017-01-13 MED ORDER — MISOPROSTOL 200 MCG PO TABS
ORAL_TABLET | ORAL | Status: AC
  Filled 2017-01-13: qty 4

## 2017-01-13 MED ORDER — EPHEDRINE 5 MG/ML INJ: 10.0000 mg | INTRAVENOUS | Status: DC | PRN

## 2017-01-13 MED ORDER — LIDOCAINE HCL (PF) 1 % IJ SOLN
INTRAMUSCULAR | Status: AC
  Filled 2017-01-13: qty 30

## 2017-01-13 MED ORDER — PHENYLEPHRINE 40 MCG/ML (10ML) SYRINGE FOR IV PUSH (FOR BLOOD PRESSURE SUPPORT): 80.0000 ug | PREFILLED_SYRINGE | INTRAVENOUS | Status: DC | PRN

## 2017-01-13 MED ORDER — TERBUTALINE SULFATE 1 MG/ML IJ SOLN: 0.2500 mg | Freq: Once | INTRAMUSCULAR | Status: DC | PRN

## 2017-01-13 MED ORDER — OXYTOCIN 40 UNITS IN LACTATED RINGERS INFUSION - SIMPLE MED: 1.0000 m[IU]/min | INTRAVENOUS | Status: DC

## 2017-01-13 MED ORDER — LACTATED RINGERS IV SOLN: 500.0000 mL | INTRAVENOUS | Status: DC | PRN

## 2017-01-13 MED ORDER — LACTATED RINGERS IV SOLN
INTRAVENOUS | Status: DC
  Administered 2017-01-14: 06:00:00 via INTRAVENOUS

## 2017-01-13 MED ORDER — OXYTOCIN 40 UNITS IN LACTATED RINGERS INFUSION - SIMPLE MED: 2.5000 [IU]/h | INTRAVENOUS | Status: DC

## 2017-01-13 MED ORDER — MISOPROSTOL 25 MCG QUARTER TABLET: 25.0000 ug | ORAL_TABLET | ORAL | Status: DC | PRN

## 2017-01-13 MED ORDER — SODIUM CHLORIDE 0.9 % IV SOLN
INTRAVENOUS | Status: DC | PRN
  Administered 2017-01-13 (×2): 5 mL via EPIDURAL

## 2017-01-13 MED ORDER — OXYTOCIN 10 UNIT/ML IJ SOLN: 10.0000 [IU] | Freq: Once | INTRAMUSCULAR | Status: DC

## 2017-01-13 MED ORDER — AMMONIA AROMATIC IN INHA
RESPIRATORY_TRACT | Status: AC
  Filled 2017-01-13: qty 10

## 2017-01-13 MED ORDER — LIDOCAINE-EPINEPHRINE (PF) 1.5 %-1:200000 IJ SOLN
INTRAMUSCULAR | Status: DC | PRN
  Administered 2017-01-13: 3 mL via EPIDURAL

## 2017-01-13 MED ORDER — DIPHENHYDRAMINE HCL 50 MG/ML IJ SOLN: 12.5000 mg | INTRAMUSCULAR | Status: DC | PRN

## 2017-01-13 MED ORDER — LACTATED RINGERS IV SOLN
500.0000 mL | Freq: Once | INTRAVENOUS | Status: AC
  Administered 2017-01-13: 500 mL via INTRAVENOUS

## 2017-01-13 MED ORDER — OXYTOCIN 10 UNIT/ML IJ SOLN
INTRAMUSCULAR | Status: AC
  Filled 2017-01-13: qty 2

## 2017-01-13 MED ORDER — ZOLPIDEM TARTRATE 5 MG PO TABS: 5.0000 mg | ORAL_TABLET | Freq: Every evening | ORAL | Status: DC | PRN

## 2017-01-13 MED ORDER — FENTANYL 2.5 MCG/ML W/ROPIVACAINE 0.15% IN NS 100 ML EPIDURAL (ARMC)
EPIDURAL | Status: AC
  Filled 2017-01-13: qty 100

## 2017-01-13 MED ORDER — FENTANYL 2.5 MCG/ML W/ROPIVACAINE 0.15% IN NS 100 ML EPIDURAL (ARMC)
12.0000 mL/h | EPIDURAL | Status: DC
  Administered 2017-01-13: 12 mL/h via EPIDURAL

## 2017-01-13 MED ORDER — OXYTOCIN BOLUS FROM INFUSION: 500.0000 mL | Freq: Once | INTRAVENOUS | Status: DC

## 2017-01-13 MED ORDER — ONDANSETRON HCL 4 MG/2ML IJ SOLN: 4.0000 mg | Freq: Four times a day (QID) | INTRAMUSCULAR | Status: DC | PRN

## 2017-01-13 MED ORDER — LIDOCAINE HCL (PF) 1 % IJ SOLN: 30.0000 mL | INTRAMUSCULAR | Status: DC | PRN

## 2017-01-13 MED ORDER — ACETAMINOPHEN 325 MG PO TABS
650.0000 mg | ORAL_TABLET | ORAL | Status: DC | PRN
  Administered 2017-01-13 – 2017-01-14 (×2): 650 mg via ORAL
  Filled 2017-01-13 (×3): qty 2

## 2017-01-13 MED ORDER — FENTANYL CITRATE (PF) 100 MCG/2ML IJ SOLN: 50.0000 ug | INTRAMUSCULAR | Status: DC | PRN

## 2017-01-13 MED ORDER — LIDOCAINE HCL (PF) 1 % IJ SOLN
INTRAMUSCULAR | Status: DC | PRN
  Administered 2017-01-13: 1 mL via INTRADERMAL

## 2017-01-13 NOTE — Anesthesia Procedure Notes (Signed)
Epidural Patient location during procedure: OB Start time: 01/13/2017 11:30 PM End time: 01/13/2017 11:34 PM  Staffing Anesthesiologist: Margorie John K Performed: anesthesiologist   Preanesthetic Checklist Completed: patient identified, site marked, surgical consent, pre-op evaluation, timeout performed, IV checked, risks and benefits discussed and monitors and equipment checked  Epidural Patient position: sitting Prep: Betadine Patient monitoring: heart rate, continuous pulse ox and blood pressure Approach: midline Location: L3-L4 Injection technique: LOR saline  Needle:  Needle type: Tuohy  Needle gauge: 17 G Needle length: 9 cm and 9 Needle insertion depth: 5.5 cm Catheter type: closed end flexible Catheter size: 19 Gauge Catheter at skin depth: 10.5 cm Test dose: negative and 1.5% lidocaine with Epi 1:200 K  Assessment Sensory level: T10 Events: blood not aspirated, injection not painful, no injection resistance, negative IV test and no paresthesia  Additional Notes 1 attempt Pt. Evaluated and documentation done after procedure finished. Patient identified. Risks/Benefits/Options discussed with patient including but not limited to bleeding, infection, nerve damage, paralysis, failed block, incomplete pain control, headache, blood pressure changes, nausea, vomiting, reactions to medication both or allergic, itching and postpartum back pain. Confirmed with bedside nurse the patient's most recent platelet count. Confirmed with patient that they are not currently taking any anticoagulation, have any bleeding history or any family history of bleeding disorders. Patient expressed understanding and wished to proceed. All questions were answered. Sterile technique was used throughout the entire procedure. Please see nursing notes for vital signs. Test dose was given through epidural catheter and negative prior to continuing to dose epidural or start infusion. Warning signs of  high block given to the patient including shortness of breath, tingling/numbness in hands, complete motor block, or any concerning symptoms with instructions to call for help. Patient was given instructions on fall risk and not to get out of bed. All questions and concerns addressed with instructions to call with any issues or inadequate analgesia.   Patient tolerated the insertion well without immediate complications.Reason for block:procedure for pain

## 2017-01-13 NOTE — Anesthesia Preprocedure Evaluation (Addendum)
Anesthesia Evaluation  Patient identified by MRN, date of birth, ID band Patient awake    Reviewed: Allergy & Precautions, H&P , NPO status , Patient's Chart, lab work & pertinent test results  Airway Mallampati: III  TM Distance: >3 FB Neck ROM: full    Dental  (+) Poor Dentition   Pulmonary neg pulmonary ROS,           Cardiovascular Exercise Tolerance: Good (-) hypertensionnegative cardio ROS       Neuro/Psych    GI/Hepatic negative GI ROS,   Endo/Other    Renal/GU   negative genitourinary   Musculoskeletal   Abdominal   Peds  Hematology negative hematology ROS (+)   Anesthesia Other Findings History reviewed. No pertinent past medical history.  Past Surgical History: No date: none  BMI    Body Mass Index:  31.58 kg/m      Reproductive/Obstetrics (+) Pregnancy                             Anesthesia Physical Anesthesia Plan  ASA: II and emergent  Anesthesia Plan: Epidural   Post-op Pain Management:    Induction:   PONV Risk Score and Plan:   Airway Management Planned:   Additional Equipment:   Intra-op Plan:   Post-operative Plan:   Informed Consent: I have reviewed the patients History and Physical, chart, labs and discussed the procedure including the risks, benefits and alternatives for the proposed anesthesia with the patient or authorized representative who has indicated his/her understanding and acceptance.     Plan Discussed with: Anesthesiologist  Anesthesia Plan Comments:        Anesthesia Quick Evaluation

## 2017-01-14 ENCOUNTER — Encounter: Payer: Self-pay | Admitting: *Deleted

## 2017-01-14 ENCOUNTER — Encounter
Admission: EM | Disposition: A | Discharge status: Home / Self Care | Payer: Self-pay | Source: Home / Self Care | Attending: Certified Nurse Midwife

## 2017-01-14 DIAGNOSIS — O480 Post-term pregnancy: Secondary | ICD-10-CM

## 2017-01-14 DIAGNOSIS — Z3A40 40 weeks gestation of pregnancy: Secondary | ICD-10-CM

## 2017-01-14 DIAGNOSIS — Z98891 History of uterine scar from previous surgery: Secondary | ICD-10-CM

## 2017-01-14 SURGERY — Surgical Case
Anesthesia: Epidural

## 2017-01-14 MED ORDER — KETAMINE HCL 50 MG/ML IJ SOLN
INTRAMUSCULAR | Status: AC
  Filled 2017-01-14: qty 10

## 2017-01-14 MED ORDER — SODIUM CHLORIDE 0.9% FLUSH
3.0000 mL | INTRAVENOUS | Status: DC | PRN
Start: 2017-01-14 — End: 2017-01-18

## 2017-01-14 MED ORDER — LIDOCAINE 5 % EX PTCH
MEDICATED_PATCH | CUTANEOUS | Status: DC | PRN
  Administered 2017-01-14: 1 via TRANSDERMAL

## 2017-01-14 MED ORDER — PROPOFOL 10 MG/ML IV BOLUS
INTRAVENOUS | Status: AC
  Filled 2017-01-14: qty 20

## 2017-01-14 MED ORDER — CHLOROPROCAINE HCL (PF) 3 % IJ SOLN
INTRAMUSCULAR | Status: AC
  Filled 2017-01-14: qty 40

## 2017-01-14 MED ORDER — GENTAMICIN SULFATE 40 MG/ML IJ SOLN
5.0000 mg/kg | Freq: Once | INTRAVENOUS | Status: AC
  Administered 2017-01-14: 420 mg via INTRAVENOUS
  Filled 2017-01-14: qty 10.5

## 2017-01-14 MED ORDER — SODIUM CHLORIDE 0.9 % IV SOLN
2.0000 g | Freq: Four times a day (QID) | INTRAVENOUS | Status: DC
  Administered 2017-01-14 – 2017-01-15 (×6): 2 g via INTRAVENOUS
  Filled 2017-01-14 (×9): qty 2000

## 2017-01-14 MED ORDER — KETOROLAC TROMETHAMINE 30 MG/ML IJ SOLN
30.0000 mg | Freq: Four times a day (QID) | INTRAMUSCULAR | Status: AC | PRN
  Administered 2017-01-14: 30 mg via INTRAVENOUS

## 2017-01-14 MED ORDER — LIDOCAINE HCL (PF) 2 % IJ SOLN: INTRAMUSCULAR | Status: DC | PRN

## 2017-01-14 MED ORDER — WITCH HAZEL-GLYCERIN EX PADS: 1.0000 "application " | MEDICATED_PAD | CUTANEOUS | Status: DC | PRN

## 2017-01-14 MED ORDER — MEPERIDINE HCL 25 MG/ML IJ SOLN: 6.2500 mg | INTRAMUSCULAR | Status: DC | PRN

## 2017-01-14 MED ORDER — TETANUS-DIPHTH-ACELL PERTUSSIS 5-2.5-18.5 LF-MCG/0.5 IM SUSP: 0.5000 mL | Freq: Once | INTRAMUSCULAR | Status: DC

## 2017-01-14 MED ORDER — SENNOSIDES-DOCUSATE SODIUM 8.6-50 MG PO TABS
2.0000 | ORAL_TABLET | ORAL | Status: DC
  Administered 2017-01-15 – 2017-01-18 (×2): 2 via ORAL
  Filled 2017-01-14 (×2): qty 2

## 2017-01-14 MED ORDER — OXYCODONE HCL 5 MG PO TABS
5.0000 mg | ORAL_TABLET | ORAL | Status: AC | PRN
Start: 2017-01-14 — End: 2017-01-15
  Filled 2017-01-14 (×2): qty 1

## 2017-01-14 MED ORDER — COCONUT OIL OIL
1.0000 "application " | TOPICAL_OIL | Status: DC | PRN
  Administered 2017-01-15: 1 via TOPICAL
  Filled 2017-01-14: qty 120

## 2017-01-14 MED ORDER — DIBUCAINE 1 % RE OINT: 1.0000 "application " | TOPICAL_OINTMENT | RECTAL | Status: DC | PRN

## 2017-01-14 MED ORDER — CHLOROPROCAINE HCL 1 % IJ SOLN
INTRAMUSCULAR | Status: DC | PRN
  Administered 2017-01-14 (×4): 10 mL

## 2017-01-14 MED ORDER — NALBUPHINE HCL 10 MG/ML IJ SOLN: 5.0000 mg | INTRAMUSCULAR | Status: DC | PRN

## 2017-01-14 MED ORDER — LACTATED RINGERS IV SOLN
INTRAVENOUS | Status: DC
  Administered 2017-01-15 (×2): via INTRAVENOUS

## 2017-01-14 MED ORDER — FENTANYL CITRATE (PF) 100 MCG/2ML IJ SOLN
25.0000 ug | INTRAMUSCULAR | Status: AC | PRN
  Administered 2017-01-14 (×6): 25 ug via INTRAVENOUS
  Filled 2017-01-14: qty 2

## 2017-01-14 MED ORDER — MENTHOL 3 MG MT LOZG
1.0000 | LOZENGE | OROMUCOSAL | Status: DC | PRN
  Filled 2017-01-14: qty 9

## 2017-01-14 MED ORDER — OXYCODONE HCL 5 MG PO TABS
10.0000 mg | ORAL_TABLET | ORAL | Status: DC | PRN
Start: 2017-01-14 — End: 2017-01-19
  Administered 2017-01-15 (×4): 10 mg via ORAL
  Administered 2017-01-16: 5 mg via ORAL
  Filled 2017-01-14 (×4): qty 2

## 2017-01-14 MED ORDER — MORPHINE SULFATE (PF) 0.5 MG/ML IJ SOLN
INTRAMUSCULAR | Status: AC
  Filled 2017-01-14: qty 10

## 2017-01-14 MED ORDER — MORPHINE SULFATE (PF) 0.5 MG/ML IJ SOLN
INTRAMUSCULAR | Status: DC | PRN
  Administered 2017-01-14: 4 mg via EPIDURAL

## 2017-01-14 MED ORDER — FERROUS SULFATE 325 (65 FE) MG PO TABS
325.0000 mg | ORAL_TABLET | Freq: Two times a day (BID) | ORAL | Status: DC
  Administered 2017-01-14 – 2017-01-18 (×9): 325 mg via ORAL
  Filled 2017-01-14 (×9): qty 1

## 2017-01-14 MED ORDER — ACETAMINOPHEN 325 MG PO TABS
650.0000 mg | ORAL_TABLET | ORAL | Status: DC | PRN
  Administered 2017-01-14 – 2017-01-18 (×9): 650 mg via ORAL
  Filled 2017-01-14 (×9): qty 2

## 2017-01-14 MED ORDER — ONDANSETRON HCL 4 MG/2ML IJ SOLN: 4.0000 mg | Freq: Three times a day (TID) | INTRAMUSCULAR | Status: DC | PRN

## 2017-01-14 MED ORDER — MEASLES, MUMPS & RUBELLA VAC ~~LOC~~ INJ
0.5000 mL | INJECTION | Freq: Once | SUBCUTANEOUS | Status: DC
  Filled 2017-01-14: qty 0.5

## 2017-01-14 MED ORDER — SODIUM CHLORIDE 0.9 % IV SOLN
INTRAVENOUS | Status: AC
  Administered 2017-01-14: 2 g via INTRAVENOUS
  Filled 2017-01-14: qty 2000

## 2017-01-14 MED ORDER — LIDOCAINE 5 % EX PTCH
MEDICATED_PATCH | CUTANEOUS | Status: AC
  Filled 2017-01-14: qty 1

## 2017-01-14 MED ORDER — IBUPROFEN 800 MG PO TABS
800.0000 mg | ORAL_TABLET | Freq: Three times a day (TID) | ORAL | Status: DC
  Administered 2017-01-14 – 2017-01-18 (×13): 800 mg via ORAL
  Filled 2017-01-14 (×12): qty 1

## 2017-01-14 MED ORDER — ACETAMINOPHEN 500 MG PO TABS: 1000.0000 mg | ORAL_TABLET | Freq: Four times a day (QID) | ORAL | Status: AC

## 2017-01-14 MED ORDER — SIMETHICONE 80 MG PO CHEW
80.0000 mg | CHEWABLE_TABLET | ORAL | Status: DC
  Filled 2017-01-14: qty 1

## 2017-01-14 MED ORDER — NALBUPHINE HCL 10 MG/ML IJ SOLN: 5.0000 mg | Freq: Once | INTRAMUSCULAR | Status: DC | PRN

## 2017-01-14 MED ORDER — NALOXONE HCL 0.4 MG/ML IJ SOLN: 0.4000 mg | INTRAMUSCULAR | Status: DC | PRN

## 2017-01-14 MED ORDER — OXYCODONE HCL 5 MG PO TABS: 5.0000 mg | ORAL_TABLET | Freq: Once | ORAL | Status: DC | PRN

## 2017-01-14 MED ORDER — KETAMINE HCL 50 MG/ML IJ SOLN
INTRAMUSCULAR | Status: DC | PRN
  Administered 2017-01-14: 50 mg via INTRAMUSCULAR

## 2017-01-14 MED ORDER — KETOROLAC TROMETHAMINE 30 MG/ML IJ SOLN: 30.0000 mg | Freq: Four times a day (QID) | INTRAMUSCULAR | Status: AC | PRN

## 2017-01-14 MED ORDER — MIDAZOLAM HCL 2 MG/2ML IJ SOLN
INTRAMUSCULAR | Status: AC
  Filled 2017-01-14: qty 2

## 2017-01-14 MED ORDER — OXYCODONE HCL 5 MG/5ML PO SOLN
5.0000 mg | Freq: Once | ORAL | Status: DC | PRN
  Filled 2017-01-14: qty 5

## 2017-01-14 MED ORDER — OXYTOCIN 40 UNITS IN LACTATED RINGERS INFUSION - SIMPLE MED
INTRAVENOUS | Status: DC | PRN
  Administered 2017-01-14: 800 mL via INTRAVENOUS

## 2017-01-14 MED ORDER — OXYTOCIN 40 UNITS IN LACTATED RINGERS INFUSION - SIMPLE MED
2.5000 [IU]/h | INTRAVENOUS | Status: AC
  Administered 2017-01-14: 2.5 [IU]/h via INTRAVENOUS
  Filled 2017-01-14: qty 1000

## 2017-01-14 MED ORDER — PRENATAL MULTIVITAMIN CH
1.0000 | ORAL_TABLET | Freq: Every day | ORAL | Status: DC
  Administered 2017-01-14 – 2017-01-18 (×5): 1 via ORAL
  Filled 2017-01-14 (×5): qty 1

## 2017-01-14 MED ORDER — FENTANYL CITRATE (PF) 100 MCG/2ML IJ SOLN
INTRAMUSCULAR | Status: AC
  Administered 2017-01-14: 25 ug via INTRAVENOUS
  Filled 2017-01-14: qty 2

## 2017-01-14 MED ORDER — SIMETHICONE 80 MG PO CHEW: 80.0000 mg | CHEWABLE_TABLET | ORAL | Status: DC | PRN

## 2017-01-14 MED ORDER — SIMETHICONE 80 MG PO CHEW
80.0000 mg | CHEWABLE_TABLET | Freq: Three times a day (TID) | ORAL | Status: DC
  Administered 2017-01-14 – 2017-01-18 (×13): 80 mg via ORAL
  Filled 2017-01-14 (×13): qty 1

## 2017-01-14 MED ORDER — KETOROLAC TROMETHAMINE 30 MG/ML IJ SOLN
INTRAMUSCULAR | Status: AC
  Administered 2017-01-14: 30 mg via INTRAVENOUS
  Filled 2017-01-14: qty 1

## 2017-01-14 MED ORDER — DIPHENHYDRAMINE HCL 25 MG PO CAPS
25.0000 mg | ORAL_CAPSULE | ORAL | Status: DC | PRN
Start: 2017-01-14 — End: 2017-01-18

## 2017-01-14 MED ORDER — DIPHENHYDRAMINE HCL 50 MG/ML IJ SOLN: 12.5000 mg | INTRAMUSCULAR | Status: DC | PRN

## 2017-01-14 MED ORDER — LIDOCAINE 2% (20 MG/ML) 5 ML SYRINGE
INTRAMUSCULAR | Status: DC | PRN
  Administered 2017-01-14 (×3): 100 mg via INTRAVENOUS

## 2017-01-14 MED ORDER — SOD CITRATE-CITRIC ACID 500-334 MG/5ML PO SOLN
ORAL | Status: AC
  Filled 2017-01-14: qty 15

## 2017-01-14 MED ORDER — PHENYLEPHRINE HCL 10 MG/ML IJ SOLN
INTRAMUSCULAR | Status: DC | PRN
  Administered 2017-01-14 (×4): 100 ug via INTRAVENOUS

## 2017-01-14 MED ORDER — INFLUENZA VAC SPLIT QUAD 0.5 ML IM SUSY: 0.5000 mL | PREFILLED_SYRINGE | INTRAMUSCULAR | Status: DC

## 2017-01-14 MED ORDER — MIDAZOLAM HCL 2 MG/2ML IJ SOLN
INTRAMUSCULAR | Status: DC | PRN
  Administered 2017-01-14: 1.5 mg via INTRAVENOUS

## 2017-01-14 MED ORDER — OXYCODONE HCL 5 MG PO TABS
5.0000 mg | ORAL_TABLET | ORAL | Status: DC | PRN
  Administered 2017-01-14 – 2017-01-18 (×13): 5 mg via ORAL
  Filled 2017-01-14 (×14): qty 1

## 2017-01-14 MED ORDER — DIPHENHYDRAMINE HCL 25 MG PO CAPS
25.0000 mg | ORAL_CAPSULE | Freq: Four times a day (QID) | ORAL | Status: DC | PRN
Start: 2017-01-14 — End: 2017-01-19

## 2017-01-14 SURGICAL SUPPLY — 28 items
BENZOIN TINCTURE PRP APPL 2/3 (GAUZE/BANDAGES/DRESSINGS) ×3 IMPLANT
CANISTER SUCT 3000ML PPV (MISCELLANEOUS) ×3 IMPLANT
CHLORAPREP W/TINT 26ML (MISCELLANEOUS) ×6 IMPLANT
CLOSURE WOUND 1/2 X4 (GAUZE/BANDAGES/DRESSINGS) ×1
DERMABOND ADVANCED (GAUZE/BANDAGES/DRESSINGS) ×2
DERMABOND ADVANCED .7 DNX12 (GAUZE/BANDAGES/DRESSINGS) ×1 IMPLANT
DRSG TELFA 3X8 NADH (GAUZE/BANDAGES/DRESSINGS) ×3 IMPLANT
ELECT REM PT RETURN 9FT ADLT (ELECTROSURGICAL) ×3
ELECTRODE REM PT RTRN 9FT ADLT (ELECTROSURGICAL) ×1 IMPLANT
GAUZE SPONGE 4X4 12PLY STRL (GAUZE/BANDAGES/DRESSINGS) IMPLANT
GLOVE BIO SURGEON STRL SZ8 (GLOVE) ×18 IMPLANT
GOWN STRL REUS W/ TWL LRG LVL3 (GOWN DISPOSABLE) ×2 IMPLANT
GOWN STRL REUS W/ TWL XL LVL3 (GOWN DISPOSABLE) ×1 IMPLANT
GOWN STRL REUS W/TWL LRG LVL3 (GOWN DISPOSABLE) ×4
GOWN STRL REUS W/TWL XL LVL3 (GOWN DISPOSABLE) ×2
NS IRRIG 1000ML POUR BTL (IV SOLUTION) ×3 IMPLANT
PACK C SECTION AR (MISCELLANEOUS) ×3 IMPLANT
PAD OB MATERNITY 4.3X12.25 (PERSONAL CARE ITEMS) ×3 IMPLANT
PAD PREP 24X41 OB/GYN DISP (PERSONAL CARE ITEMS) ×3 IMPLANT
SPONGE LAP 18X18 5 PK (GAUZE/BANDAGES/DRESSINGS) ×3 IMPLANT
STRAP SAFETY BODY (MISCELLANEOUS) ×3 IMPLANT
STRIP CLOSURE SKIN 1/2X4 (GAUZE/BANDAGES/DRESSINGS) ×2 IMPLANT
SUT CHROMIC 1-0 (SUTURE) ×9 IMPLANT
SUT MAXON ABS #0 GS21 30IN (SUTURE) ×6 IMPLANT
SUT PLAIN GUT 0 (SUTURE) ×6 IMPLANT
SUT VIC AB 2-0 CT1 27 (SUTURE) ×4
SUT VIC AB 2-0 CT1 TAPERPNT 27 (SUTURE) ×2 IMPLANT
SUT VIC AB 4-0 KS 27 (SUTURE) ×3 IMPLANT

## 2017-01-14 NOTE — H&P (Signed)
History and Physical   HPI  Dayra Rapley is a 26 y.o. G1P0 at [redacted]w[redacted]d Estimated Date of Delivery: 01/08/17 who is being admitted for  labor management and SROM clear fluid. She was scheduled for induction for postdates but presented in labor.   OB History  Obstetric History   G1   P0   T0   P0   A0   L0    SAB0   TAB0   Ectopic0   Multiple0   Live Births0     # Outcome Date GA Lbr Len/2nd Weight Sex Delivery Anes PTL Lv  1 Current               PROBLEM LIST  Pregnancy complications or risks: Patient Active Problem List   Diagnosis Date Noted  . Labor and delivery, indication for care 01/13/2017  . Anemia affecting pregnancy in third trimester 11/23/2016  . Elevated glucose tolerance test 11/23/2016  . Pregnancy 08/30/2016  . Amenorrhea 07/02/2016    Prenatal labs and studies: ABO, Rh: --/--/B POS (09/20 2036) Antibody: NEG (09/20 2036) Rubella: <0.90 (03/09 1002) RPR: Non Reactive (03/09 1002)  HBsAg: Negative (03/09 1002)  HIV:   Negative ONG:EXBMWUXL (08/21 1524) 1 hr Glucola  170. 3 hr GTT: [phone] Genetic screening normal Anatomy US normal  Prenatal Transfer Tool  Maternal Diabetes: No Genetic Screening: Normal Maternal Ultrasounds/Referrals: Normal Fetal Ultrasounds or other Referrals:  None Maternal Substance Abuse:  No Significant Maternal Medications:  None Significant Maternal Lab Results: None   History reviewed. No pertinent past medical history.   Past Surgical History:  Procedure Laterality Date  . none       Medications    Current Discharge Medication List    CONTINUE these medications which have NOT CHANGED   Details  ferrous sulfate 325 (65 FE) MG EC tablet Take 325 mg by mouth 3 (three) times daily with meals.    Prenatal Vit-Fe Fumarate-FA (PRENATAL VITAMINS) 28-0.8 MG TABS Take by mouth.         Allergies  Codeine  Review of Systems  Constitutional: negative Eyes: negative Ears, nose, mouth, throat, and face:  negative Respiratory: negative Cardiovascular: negative Gastrointestinal: negative Genitourinary:negative Integument/breast: negative Hematologic/lymphatic: negative Musculoskeletal:negative Neurological: negative Behavioral/Psych: negative Endocrine: negative Allergic/Immunologic: negative  Physical Exam  BP (!) 143/79   Pulse (!) 113   Temp (!) 102.9 F (39.4 C) (Axillary)   Resp 16   Ht  (1.626 m)   Wt 184 lb (83.5 kg)   LMP 04/11/2016 (Exact Date)   SpO2 100%   BMI 31.58 kg/m   Lungs:  CTA B Cardio: RRR  Abd: Soft, gravid, NT Presentation: cephalic EXT: No C/C/ 1+ Edema DTRs: 2+ B CERVIX: not examined on admission due to SROM See Prenatal records for more detailed PE.     FHR:  Baseline: 135 bpm, Variability: Good {> 6 bpm), Accelerations: Reactive and Decelerations: Absent  Toco: Uterine Contractions: Frequency: Every 2 minutes, Duration: 60-90 seconds and Intensity: moderate   Test Results  Results for orders placed or performed during the hospital encounter of 01/13/17 (from the past 24 hour(s))  CBC     Status: Abnormal   Collection Time: 01/13/17  8:36 PM  Result Value Ref Range   WBC 12.5 (H) 3.6 - 11.0 K/uL   RBC 3.96 3.80 - 5.20 MIL/uL   Hemoglobin 11.0 (L) 12.0 - 16.0 g/dL   HCT 36.6 (L) 44.0 - 34.7 %   MCV 84.3 80.0 - 100.0 fL  MCH 27.8 26.0 - 34.0 pg   MCHC 33.0 32.0 - 36.0 g/dL   RDW 16.1 (H) 09.6 - 04.5 %   Platelets 245 150 - 440 K/uL  Type and screen     Status: None   Collection Time: 01/13/17  8:36 PM  Result Value Ref Range   ABO/RH(D) B POS    Antibody Screen NEG    Sample Expiration 01/16/2017    Group B Strep negative  Assessment   G1P0 at [redacted]w[redacted]d Estimated Date of Delivery: 01/08/17  The fetus is reassuring. Category 1  Patient Active Problem List   Diagnosis Date Noted  . Labor and delivery, indication for care 01/13/2017  . Anemia affecting pregnancy in third trimester 11/23/2016  . Elevated glucose tolerance  test 11/23/2016  . Pregnancy 08/30/2016  . Amenorrhea 07/02/2016    Plan  1. Admit to L&D for continue present management, augmentation if no cervical change 2. EFM:- Category 1 3. Epidural if desired. Stadol for IV pain until epidural requested. 4. Admission labs    [name], [facility] 01/14/2017 6:57 AM

## 2017-01-14 NOTE — Progress Notes (Signed)
LABOR NOTE   Lindsay Mcdowell 26 y.o.GP@ at [redacted]w[redacted]d Active phase labor.Called by RN and notified of fetal tachycardia with moderate to marked variability.   SUBJECTIVE:  She is comfortable with epidural, she says her face feels warm.  OBJECTIVE:  BP (!) 143/79   Pulse (!) 113   Temp (!) 102.9 F (39.4 C) (Axillary)   Resp 16   Ht  (1.626 m)   Wt 184 lb (83.5 kg)   LMP 04/11/2016 (Exact Date)   SpO2 100%   BMI 31.58 kg/m  No intake/output data recorded.  She has shown cervical change. CERVIX: 10 cm: SVE:   Dilation: Lip/rim Effacement (%): 100 Station: 0, +1 Exam by:: MBS CONTRACTIONS: regular, every 1-2 minutes FHR: Fetal heart tracing reviewed. Baseline: 205 bpm, Variability: Good {> 6 bpm), Accelerations: none and Decelerations: Absent Category II   Analgesia: Epidural  Labs: Lab Results  Component Value Date   WBC 12.5 (H) 01/13/2017   HGB 11.0 (L) 01/13/2017   HCT 33.4 (L) 01/13/2017   MCV 84.3 01/13/2017   PLT 245 01/13/2017    ASSESSMENT: 1) Labor curve reviewed.       Progress: Active phase labor.     Membranes: ruptured, clear      2)  Chorioamnionitis   Active Problems:   Labor and delivery, indication for care Postdates Chorioamnionitis   PLAN: IV fluid bolus, tylenol, IV antibiotics, oxygen via face mask.  Dr. Greggory Keen consulted.  Start pushing  [name],CNM 01/14/2017 7:06 AM

## 2017-01-14 NOTE — Transfer of Care (Signed)
Immediate Anesthesia Transfer of Care Note  Patient: Lindsay Mcdowell  Procedure(s) Performed: Procedure(s): CESAREAN SECTION (N/A)  Patient Location: PACU  Anesthesia Type:Epidural  Level of Consciousness: awake, alert  and oriented  Airway & Oxygen Therapy: Patient Spontanous Breathing and Patient connected to nasal cannula oxygen  Post-op Assessment: Report given to RN and Post -op Vital signs reviewed and stable  Post vital signs: Reviewed and stable  Last Vitals:  Vitals:   01/14/17 0429 01/14/17 0505  BP:    Pulse:    Resp:    Temp: 36.8 C (!) 39.4 C  SpO2:      Last Pain:  Vitals:   01/14/17 0505  TempSrc: Axillary  PainSc:          Complications: No apparent anesthesia complications

## 2017-01-14 NOTE — Progress Notes (Signed)
LABOR NOTE   Lindsay Mcdowell 26 y.o.GP@ at [redacted]w[redacted]d Active phase labor., Adequate uterine activity - intensity and frequency. and pushing.  SUBJECTIVE:  She is feeling some pressure.  OBJECTIVE:  BP (!) 143/79   Pulse (!) 113   Temp (!) 102.9 F (39.4 C) (Axillary)   Resp 16   Ht  (1.626 m)   Wt 184 lb (83.5 kg)   LMP 04/11/2016 (Exact Date)   SpO2 100%   BMI 31.58 kg/m  No intake/output data recorded.  She has shown cervical change. CERVIX: 10cm: 0/+1 station SVE:   Dilation: Lip/rim Effacement (%): 100 Station: 0, +1 Exam by:: MBS CONTRACTIONS: regular, every 1-2 minutes FHR: Fetal heart tracing reviewed. Baseline: 205 bpm, Variability: Good {> 6 bpm), Accelerations: absent and Decelerations: variable Category II  Analgesia: Epidural  Labs: Lab Results  Component Value Date   WBC 12.5 (H) 01/13/2017   HGB 11.0 (L) 01/13/2017   HCT 33.4 (L) 01/13/2017   MCV 84.3 01/13/2017   PLT 245 01/13/2017    ASSESSMENT: 1) Labor curve reviewed.       Progress: Active phase labor.     Membranes: ruptured, clear fluid      2)  Chorioamnionitis with fetal tachycardia    Active Problems:   Labor & Delivery indication for car   Chorioamnionitis with fetal tachycardia  PLAN: Dr. Greggory Keen at the bedside discussed plan of care. Pt given option to proceed with c-section or attempt with forceps. Pt request to attempt forceps and proceed with c- section if unsuccessful.  Benefits and risks reviewed. PT agrees to plan.   [name], CNM 01/14/2017 7:12 AM

## 2017-01-14 NOTE — Anesthesia Post-op Follow-up Note (Signed)
Anesthesia QCDR form completed.        

## 2017-01-14 NOTE — Anesthesia Procedure Notes (Signed)
Performed by: Sanaiyah Kirchhoff Pre-anesthesia Checklist: Patient identified, Emergency Drugs available, Suction available, Patient being monitored and Timeout performed Oxygen Delivery Method: Nasal cannula       

## 2017-01-14 NOTE — Op Note (Signed)
[name] PROCEDURE DATE: 01/14/2017  PREOPERATIVE DIAGNOSES: Intrauterine pregnancy at [redacted]w[redacted]d weeks gestation; chorioamnionitis, failure to progress: arrest of descent and Fetal tachycardia  POSTOPERATIVE DIAGNOSES: The same and viable female infant 8 lbs. 10 oz.  PROCEDURE: Primary Low Transverse Cesarean Section after trial of forceps  ANESTHESIA:: Epidural ANESTHESIOLOGIST: Dr. Randa Ngo  SURGEON:  Dr. Daphine Deutscher A. Erich Kochan ASSISTANT:: [name], certified nurse midwife    INDICATIONS: Lindsay Mcdowell is a 26 y.o. G1P0 at [redacted]w[redacted]d here for cesarean section delivery due to the indications listed under preoperative diagnoses; please see preoperative note for further details.  The risks of cesarean section were discussed with the patient including but not limited to: bleeding which may require transfusion or reoperation; infection which may require antibiotics; injury to bowel, bladder, ureters or other surrounding organs; injury to the fetus; need for additional procedures including hysterectomy in the event of a life-threatening hemorrhage; placental abnormalities wth subsequent pregnancies, incisional problems, thromboembolic phenomenon and other postoperative/anesthesia complications. The patient concurred with the proposed plan, giving informed written consent for the procedure. All questions were addressed.  FINDINGS:  Viable female infant in cephalic presentation.  Apgars 8 and 8.  Clear amniotic fluid.  Intact placenta, three vessel cord.  Normal uterus, fallopian tubes and ovaries bilaterally. Cord pH 7.09/PCO2 59/bicarbonate 17.9  I/O's: Total I/O In: 400 [I.V.:400] Out: 900 [Urine:150; Blood:750] SPECIMENS:: Placenta and Arterial Cord Gas ANTIBIOTIC PROPHYLAXIS: Ampicillin/gentamicin COUNTS:  YES COMPLICATIONS: None immediate  PROCEDURE IN DETAIL: Upon presentation, patient had fetal tachycardia to the 200-210 range no periodic decelerations; patient was febrile; patient had been  started on IV ampicillin and gentamicin for vaginitis complete complete and +2 station; amniotic fluid was clear; patient has been counseled regarding trial of forceps with the understanding that if it was unsuccessful, we'll proceed with cesarean section delivery. Foley catheter was removed from the bladder. Patient was placed in the dorsal lithotomy position with good pushing efforts. Initially Tucker-McLane forceps were applied to ROA presentation; single hole with the forceps made limited progress. Forceps were removed and Simpson forceps were reapplied with a second pooling attempt. Minimal descent was made. Because of the degree of dystocia, decision was made to stop the trial of forceps and proceed with cesarean section delivery. The forceps were removed. The patient had her epidural redosed and she was transferred to the operating room where cesarean section was performed.  Patient was brought to the operating room where she had her epidural dosed and adequate level of anesthesia was determined. She was placed in the supine position with a right lateral hip roll in place. Foley catheter was already inserted and was draining clear yellow urine. The abdomen was prepped with ChloraPrep solution and draped in a sterile manner. After timeout and checking for adequate level of anesthesia the procedure was started. Pfannenstiel incision was made into the abdomen. The fascia was incised transversely and extended bilaterally with Mayo scissors.  The midline raphae was identified and separated and the peritoneum was entered. Bladder flap was created over lower uterine segment with sharp dissection. A low transverse incision was made in the uterus and this was extended cephalad and caudad in standard fashion. Amniotic fluid was clear. A vaginal hand held portion of the vertex out of the pelvis The infant was delivered through vertex presentation and was noted to be active at birth. The umbilical cord had delayed cord  clamping; it was doubly clamped and cut and the infant was handed off to the resuscitating team. Cord blood sampling was  obtained for arterial cord gas assessment. Placenta was expressed from the uterus; this was sent to pathology. Uterus was externalized onto the anterior abdominal wall and was cleared of all debris with laps. The uterine incision was closed in 2 layers using #1 chromic suture.The first layer was closed in a running locking manner. The second layer was an imbricating layer. Initially to anchor stitches at the angles of the uterine incision were made in order to help identify and restore normal anatomy. Good hemostasis was noted. The uterus was then placed back into the abdominal pelvic cavity. Gutters were cleared of all debris with laps. The incision was then closed in layers. 0 Maxon was used on the fascia in a simple running manner. Subcutaneous tissues were re approximated with 2-0 Vicryl suture. The skin was closed with a subcuticular stitch of 4-0 Vicryl. Steri-Strips were placed. A Lidoderm patch was applied for analgesia. The patient was then mobilized and taken to the recovery room in satisfactory condition.      Beth Goodlin A. Beatris Si, MD, F[facility]OG [facility]

## 2017-01-15 LAB — CBC
HEMATOCRIT: 27.2 % — AB (ref 35.0–47.0)
HEMOGLOBIN: 8.9 g/dL — AB (ref 12.0–16.0)
MCH: 28.4 pg (ref 26.0–34.0)
MCHC: 32.8 g/dL (ref 32.0–36.0)
MCV: 86.5 fL (ref 80.0–100.0)
Platelets: 177 10*3/uL (ref 150–440)
RBC: 3.15 MIL/uL — AB (ref 3.80–5.20)
RDW: 20.5 % — ABNORMAL HIGH (ref 11.5–14.5)
WBC: 15.7 10*3/uL — AB (ref 3.6–11.0)

## 2017-01-15 LAB — RPR: RPR: NONREACTIVE

## 2017-01-15 NOTE — Anesthesia Postprocedure Evaluation (Signed)
Anesthesia Post Note  Patient: Lindsay Mcdowell  Procedure(s) Performed: Procedure(s) (LRB): CESAREAN SECTION (N/A)  Patient location during evaluation: PACU Anesthesia Type: Epidural Level of consciousness: oriented and awake and alert Pain management: pain level controlled Vital Signs Assessment: post-procedure vital signs reviewed and stable Respiratory status: spontaneous breathing, respiratory function stable and patient connected to nasal cannula oxygen Cardiovascular status: blood pressure returned to baseline and stable Postop Assessment: no headache, no backache and no apparent nausea or vomiting Anesthetic complications: no     Last Vitals:  Vitals:   01/15/17 0430 01/15/17 0754  BP:  (!) 96/47  Pulse:  76  Resp:  17  Temp: 36.6 C (!) 36.4 C  SpO2:  99%    Last Pain:  Vitals:   01/15/17 0845  TempSrc:   PainSc: 7                  Leontae Bostock S

## 2017-01-15 NOTE — Progress Notes (Signed)
I have reviewed the note by [name], CNM, and agree with assessment and plan. Patient doing well, pain well controlled. Continue routine postpartum/post-op care. To remove dressing later once up to shower.    Hildred Laser, MD [facility]

## 2017-01-15 NOTE — Progress Notes (Signed)
Progress Note - Cesarean Delivery  Lindsay Mcdowell is a 26 y.o. G1P1001 now PP day 1 s/p C-Section, Low Transverse .   Subjective:  Patient reports no problems with eating, voiding, or their wound. She has not passed gas.    Objective:  Vital signs in last 24 hours: Temp:  [97.4 F (36.3 C)-98.6 F (37 C)] 97.5 F (36.4 C) (09/22 0754) Pulse Rate:  [69-96] 76 (09/22 0754) Resp:  [17-20] 17 (09/22 0754) BP: (96-107)/(46-66) 96/47 (09/22 0754) SpO2:  [99 %-100 %] 99 % (09/22 0754)  Physical Exam:  General: alert, cooperative, appears stated age, fatigued and no distress Lochia: appropriate Bowel sounds decreased, abdomen distended Uterine Fundus: firm Incision: dressing dry and intact DVT Evaluation: No evidence of DVT seen on physical exam. No cords or calf tenderness. No significant calf/ankle edema.    Data Review  Recent Labs  01/13/17 2036 01/15/17 0730  HGB 11.0* 8.9*  HCT 33.4* 27.2*    Assessment:  Active Problems:   Labor and delivery, indication for care   History of cesarean section     Overview: Chorioamnionitis with fetal tachycardia-210     Trial of forceps unsuccessful due to arrest of descent     Low transverse cesarean section delivery-a pound 10 ounce female Apgars 8/;      cord pH 7.09/PCO2 59/bicarbonate 17.9   Status post Cesarean section. Doing well postoperatively.     Plan:       Continue current care.  Remove dressing today in the shower  Ambulate to assist with passing gas  [name], CNM 01/15/2017 12:22 PM

## 2017-01-16 NOTE — Plan of Care (Addendum)
Problem: Safety: Goal: Ability to remain free from injury will improve Outcome: Progressing Ambulating well in room  Problem: Pain Managment: Goal: General experience of comfort will improve Outcome: Progressing Pain is improving but still asking for PRN roxicodone  Problem: Physical Regulation: Goal: Ability to maintain clinical measurements within normal limits will improve Outcome: Progressing Within normal parameters. Few abnormal results of blood pressure and heart rate. Rechecks within normal ranges Goal: Will remain free from infection Outcome: Progressing Afebrile  Problem: Skin Integrity: Goal: Risk for impaired skin integrity will decrease Outcome: Progressing Surgical incision is clean, dry, and intact  Problem: Activity: Goal: Risk for activity intolerance will decrease Outcome: Progressing Ambulating well in room and plans to ambulate in hallway tonight  Problem: Bowel/Gastric: Goal: Will not experience complications related to bowel motility Outcome: Progressing Patient has been passing gas and has present bowel sounds  Problem: Activity: Goal: Ability to tolerate increased activity will improve Outcome: Progressing Ambulating well in room. Plans to ambulate in hallway tonight  Problem: Life Cycle: Goal: Risk for postpartum hemorrhage will decrease Outcome: Progressing Small amount of bleeding  Problem: Bowel/Gastric: Goal: Gastrointestinal status will improve Outcome: Progressing Patient has been passing gas  Problem: Skin Integrity: Goal: Demonstration of wound healing without infection will improve Outcome: Progressing Surgical incision is clean, dry, and intact

## 2017-01-16 NOTE — Progress Notes (Signed)
Progress Note - Cesarean Delivery  Lindsay Mcdowell is a 26 y.o. G1P1001 now PP day 2 s/p C-Section, Low Transverse .   Subjective:  Patient reports no problems with eating, voiding, or their wound   Objective:  Vital signs in last 24 hours: Temp:  [98.1 F (36.7 C)-98.7 F (37.1 C)] 98.1 F (36.7 C) (09/23 0756) Pulse Rate:  [73-108] 73 (09/23 0756) Resp:  [17-18] 17 (09/23 0756) BP: (103-135)/(54-85) 103/54 (09/23 0756) SpO2:  [99 %-100 %] 100 % (09/23 0756)  Physical Exam:  General: alert, cooperative, appears stated age, fatigued and no distress Lochia: appropriate Uterine Fundus: firm Incision: healing well, no significant drainage, no dehiscence, no significant erythema. Dressing removed  DVT Evaluation: No evidence of DVT seen on physical exam. No cords or calf tenderness. No significant calf/ankle edema.    Data Review  Recent Labs  01/13/17 2036 01/15/17 0730  HGB 11.0* 8.9*  HCT 33.4* 27.2*    Assessment:  Active Problems:   Labor and delivery, indication for care   History of cesarean section     Overview: Chorioamnionitis with fetal tachycardia-210     Trial of forceps unsuccessful due to arrest of descent     Low transverse cesarean section delivery-a pound 10 ounce female Apgars 8/;      cord pH 7.09/PCO2 59/bicarbonate 17.9   Status post Cesarean section. Doing well postoperatively.     Plan:       Continue current care.    [name], CNM 01/16/2017 11:44 AM

## 2017-01-16 NOTE — Plan of Care (Signed)
Problem: Pain Managment: Goal: General experience of comfort will improve Outcome: Progressing Pain at acceptable level with scheduled Motrin and PRN Roxy. As per Pt. Statement.

## 2017-01-17 ENCOUNTER — Encounter: Payer: Self-pay | Admitting: Obstetrics and Gynecology

## 2017-01-17 LAB — SURGICAL PATHOLOGY

## 2017-01-17 NOTE — Progress Notes (Signed)
Progress Note - Cesarean Delivery  Lindsay Mcdowell is a 26 y.o. G1P1001 now PP day 3 s/p C-Section, Low Transverse .   Subjective:  Patient reports no problems with eating, voiding, or their wound. She would like to stay until tomorrow. She is worried about navigating the stairs in her home to go to bed. Baby has been up all night cluster feeding and she would like the additional support for nursing as well.    Objective:  Vital signs in last 24 hours: Temp:  [98.1 F (36.7 C)-99.8 F (37.7 C)] 98.7 F (37.1 C) (09/24 0353) Pulse Rate:  [73-112] 83 (09/23 2330) Resp:  [17] 17 (09/23 0756) BP: (103-130)/(54-65) 130/65 (09/23 1958) SpO2:  [98 %-100 %] 98 % (09/23 1958)  Physical Exam:  General: alert, cooperative, appears stated age and fatigued Lochia: appropriate Uterine Fundus: firm Incision: healing well, no significant drainage, no dehiscence, no significant erythema DVT Evaluation: No evidence of DVT seen on physical exam. No cords or calf tenderness. No significant calf/ankle edema.    Data Review  Recent Labs  01/15/17 0730  HGB 8.9*  HCT 27.2*    Assessment:  Active Problems:   Labor and delivery, indication for care   History of cesarean section     Overview: Chorioamnionitis with fetal tachycardia-210     Trial of forceps unsuccessful due to arrest of descent     Low transverse cesarean section delivery-a pound 10 ounce female Apgars 8/;      cord pH 7.09/PCO2 59/bicarbonate 17.9   Status post Cesarean section. Doing well postoperatively.     Plan:       Continue current care.    [name], CNM 01/17/2017 7:45 AM

## 2017-01-17 NOTE — Lactation Note (Signed)
This note was copied from a baby's chart. Lactation Consultation Note  Patient Name: Lindsay Mcdowell Date: 01/17/2017  Pecola Leisure has had 8% wt loss since birth, mom's breasts are firmer but unable to hand express or pump substantial colostrum to supplement, encouraged mom to supplement EBM or formula after at least every other feeding and pump breasts after every other feeding.  Some swallows heard at breast, baby took 10 cc formula by curved tip syringe while sucking on my gloved hand, disorganized suck at times, lifts tongue up and down and breaks suction, formula gently and slowly pushed in cheek for baby to continue to suck.  Latches to breast easily but falls asleep easily, mom encouraged to stimulate baby to stay awake.   Maternal Data Formula Feeding for Exclusion: No (supplementation after no EBM)  Feeding Feeding Type: Formula Length of feed: 30 min  LATCH Score                   Interventions    Lactation Tools Discussed/Used Tools: 67F feeding tube / Syringe;Feeding cup   Consult Status Consult Status: Follow-up Date: 01/18/17 Follow-up type: In-patient    Dyann Kief 01/17/2017, 5:33 PM

## 2017-01-18 ENCOUNTER — Telehealth: Payer: Self-pay | Admitting: Obstetrics and Gynecology

## 2017-01-18 MED ORDER — VITAMIN D3 125 MCG (5000 UT) PO CAPS: 1.0000 | ORAL_CAPSULE | Freq: Every day | ORAL | 2 refills | Status: DC

## 2017-01-18 MED ORDER — IBUPROFEN 800 MG PO TABS: 800.0000 mg | ORAL_TABLET | Freq: Three times a day (TID) | ORAL | 0 refills | Status: DC

## 2017-01-18 MED ORDER — OXYCODONE HCL 5 MG PO TABS: 5.0000 mg | ORAL_TABLET | ORAL | 0 refills | Status: DC | PRN

## 2017-01-18 MED ORDER — DOCUSATE SODIUM 100 MG PO CAPS: 100.0000 mg | ORAL_CAPSULE | Freq: Every day | ORAL | 2 refills | Status: DC

## 2017-01-18 MED ORDER — FUSION PLUS PO CAPS: 1.0000 | ORAL_CAPSULE | Freq: Every day | ORAL | 1 refills | Status: DC

## 2017-01-18 NOTE — Discharge Planning (Signed)
Discharge instructions for both mother and baby were reviewed with patient, spouse and family.  .  Wound care package and information reviewed with patient.  Signs and symptoms of infection including urinary tract, mastitis and incision reviewed.  Instructions for breastfeeding with supplementation reviewed with patient.  Medications prescribed and dosage discussed.  Follow-up appointments reviewed.  Patient and spouse acknowledged understanding and that all questions were answered.  Patient and baby transported to visitor entrance in wheelchair and left in car with spouse.

## 2017-01-18 NOTE — Discharge Summary (Signed)
Physician Obstetric Discharge Summary  Patient ID: Lindsay Mcdowell MRN: [id number] DOB/AGE: [birth year] 26 y.o.  Reason for Admission: induction of labor Prenatal Procedures: NST and ultrasound Intrapartum Procedures: cesarean: low cervical, transverse and failer forceps Postpartum Procedures: none Complications-Operative and Postpartum: anemic  Delivery Note At 6:07 AM a viable and healthy female Lindsay Mcdowell was delivered via C-Section, Low Transverse (Presentation: ;  ).  APGAR: 8, 8; weight 8 lb 10.3 oz (3920 g).     Anesthesia:  epidural Episiotomy: Nonenone  Mom to postpartum.  Baby to Couplet care / Skin to Skin.  Lindsay Mcdowell 01/18/2017, 8:18 AM     .    H/H:  Lab Results  Component Value Date/Time   HGB 8.9 (L) 01/15/2017 07:30 AM   HGB 9.8 (L) 12/14/2016 02:31 PM   HCT 27.2 (L) 01/15/2017 07:30 AM   HCT 31.9 (L) 12/14/2016 02:31 PM    Brief Hospital Course: Lindsay Mcdowell is a G1P1001 who underwent cesarean section on 01/13/2017 - 01/14/2017.  Patient had an uncomplicated surgery; for further details of this surgery, please refer to the operative note.  Patient had an uncomplicated postpartum course.  By time of discharge on POD#2/PPD#2, her pain was controlled on oral pain medications; she had appropriate lochia and was ambulating, voiding without difficulty, tolerating regular diet and passing flatus.   She was deemed stable for discharge to home.    Discharge Diagnoses: Term Pregnancy-delivered, Amnionitis, Failed induction and LTCS  Discharge Information: Date: 01/18/2017 Activity: pelvic rest Diet: routine Baby feeding: plans to breastfeed Contraception: condoms Medications: PNV, Tylenol #3, Ibuprofen, Colace and Iron Discharged Condition: good Instructions: refer to practice specific booklet Discharge to: home  Signed: [name], CNM 01/18/2017, 8:18 AM

## 2017-01-18 NOTE — Lactation Note (Signed)
This note was copied from a baby's chart. Lactation Consultation Note  Patient Name: Lindsay Mcdowell [id number] Date: 01/18/2017  Mom has had increased trouble getting [name] to latch on correctly today as breasts are swelling with more milk volume. I did give her breast shells to wear in her bra to help evert nipples more. She has two hand pumps to use to pre pump as needed to evert nipples more. Despite that, she still had a tough time latching [name] on. He kept sliding off breast. I did note that he has an upper lip tie in that it is taut when trying to flange lips properly for latch and they don't stay flanged easily. Lips can be flanged partially with fingers, but lip is more taut than most when doing so. (Nipple shield seems to help the lips stay flanged better) He has an anterior frenulum that gets taut when crying which pulls tip of tongue inward like top shape of a heart. It does extend past gumline and elevates close to palate. Center, anterior tongue dips in a bit, but his sucking on a gloved finger feels mostly rhythmic and smooth. He may not ever need any intervention, but the parents noticed some of these things too, so we discussed it. I gave them handouts about tongue/lip ties and to mention it to MD and other LCs to help with F/U. If feedings and weight progress well, no intervention may be needed, just monitoring. PLAN: Wear Shells during day: use nipple shield until he can latch and transfer milk well without it (hopefully just a day or two); If breast(s) don;t soften well after each feeding every 2-3 hours, pump to do so (To get electric pump from [facility] in am; has two hand pumps): offer approx 10+ ml breast milk/formula until he is satisfied after each feeding not needed per MD. Adjust volume as needed for weight gain with out mom gettting engorged (if possible). LC F/U at MD office if able or call [facility] for oupt consult as needed. Keep track of feeds until gaining weight well   Maternal  Data    Feeding Feeding Type: Breast Fed (per mom)  LATCH Score                   Interventions    Lactation Tools Discussed/Used     Consult Status      Sunday Corn 01/18/2017, 7:09 PM

## 2017-01-18 NOTE — Telephone Encounter (Signed)
This patient should be scheduled with Dr.De or [name] as they are the ones who performed her c-section.

## 2017-01-18 NOTE — Telephone Encounter (Signed)
Spoke with pt and scheduled her with Dr. Algis Downs on 01/25/17 @ 8:30 am. Thanks TNP

## 2017-01-18 NOTE — Telephone Encounter (Signed)
[facility] called to schedule 1 week incision check. The next established slot I saw was on 01/28/17. Can pt be worked in for Monday or Tuesday 10/1 or 10/2? Please advise. Thanks TNP

## 2017-01-21 ENCOUNTER — Encounter: Payer: Self-pay | Admitting: Obstetrics and Gynecology

## 2017-01-21 ENCOUNTER — Ambulatory Visit (INDEPENDENT_AMBULATORY_CARE_PROVIDER_SITE_OTHER): Payer: Medicaid Other | Admitting: Obstetrics and Gynecology

## 2017-01-21 ENCOUNTER — Encounter: Payer: Medicaid Other | Admitting: Obstetrics and Gynecology

## 2017-01-21 VITALS — BP 128/71 | HR 93 | Ht 64.0 in | Wt 165.3 lb

## 2017-01-21 DIAGNOSIS — R300 Dysuria: Secondary | ICD-10-CM | POA: Diagnosis not present

## 2017-01-21 DIAGNOSIS — Z09 Encounter for follow-up examination after completed treatment for conditions other than malignant neoplasm: Secondary | ICD-10-CM

## 2017-01-21 DIAGNOSIS — Z98891 History of uterine scar from previous surgery: Secondary | ICD-10-CM

## 2017-01-21 LAB — POCT URINALYSIS DIPSTICK
BILIRUBIN UA: NEGATIVE
GLUCOSE UA: NEGATIVE
KETONES UA: NEGATIVE
LEUKOCYTES UA: NEGATIVE
NITRITE UA: NEGATIVE
Protein, UA: NEGATIVE
Spec Grav, UA: 1.01 (ref 1.010–1.025)
Urobilinogen, UA: 0.2 E.U./dL
pH, UA: 6 (ref 5.0–8.0)

## 2017-01-21 NOTE — Patient Instructions (Signed)
1. Urinalysis with urine culture is obtained today to rule out infection 2. Continue with routine postoperative precautions 3. Return in 5 weeks for final postpartum check with [name]

## 2017-01-21 NOTE — Progress Notes (Signed)
Chief complaint: 1. Postop check-1 week 2. Status post trial of forceps/primary low cervical transverse cesarean section  Lindsay Mcdowell presents today in the company of her husband for her 1 week postop check. Baby is doing well, nursing, and gaining weight. Lindsay Mcdowell is breast-feeding. She reports normal bowel function. She is having some discomfort with urination and we will check urine culture to rule out bladder infection. She is not requiring any significant narcotic pain medicine at this time.  Past medical history, past surgical history, problem list, medications, and allergies are reviewed  OBJECTIVE: BP 128/71   Pulse 93   Ht  (1.626 m)   Wt 165 lb 4.8 oz (75 kg)   LMP 04/11/2016 (Exact Date)   BMI 28.37 kg/m  Pleasant female in no acute distress. Alert and oriented. Abdomen: Soft, nontender; Pfannenstiel incision is well approximated, Steri-Strips still applied; no evidence of induration, erythema, or drainage Extremities: Warm and dry without edema  ASSESSMENT: Normal 1 week postop check status post primary low cervical transverse cesarean section  PLAN: 1. Continue with routine postoperative precautions 2. Urinalysis with urine culture was obtained to rule out UTI 3. Return in 5 weeks for final postpartum check with [name], CNM  Herold Harms, MD  Note: This dictation was prepared with Dragon dictation along with smaller phrase technology. Any transcriptional errors that result from this process are unintentional.

## 2017-01-23 LAB — URINE CULTURE: Organism ID, Bacteria: NO GROWTH

## 2017-01-25 ENCOUNTER — Encounter: Payer: Medicaid Other | Admitting: Obstetrics and Gynecology

## 2017-02-25 ENCOUNTER — Ambulatory Visit (INDEPENDENT_AMBULATORY_CARE_PROVIDER_SITE_OTHER): Payer: Medicaid Other | Admitting: Certified Nurse Midwife

## 2017-02-25 ENCOUNTER — Encounter: Payer: Self-pay | Admitting: Certified Nurse Midwife

## 2017-02-25 MED ORDER — NORETHINDRONE 0.35 MG PO TABS: 1.0000 | ORAL_TABLET | Freq: Every day | ORAL | 11 refills | Status: DC

## 2017-02-25 NOTE — Patient Instructions (Signed)
Postpartum Depression and Baby Blues The postpartum period begins right after the birth of a baby. During this time, there is often a great amount of joy and excitement. It is also a time of many changes in the life of the parents. Regardless of how many times a mother gives birth, each child brings new challenges and dynamics to the family. It is not unusual to have feelings of excitement along with confusing shifts in moods, emotions, and thoughts. All mothers are at risk of developing postpartum depression or the "baby blues." These mood changes can occur right after giving birth, or they may occur many months after giving birth. The baby blues or postpartum depression can be mild or severe. Additionally, postpartum depression can go away rather quickly, or it can be a long-term condition. What are the causes? Raised hormone levels and the rapid drop in those levels are thought to be a main cause of postpartum depression and the baby blues. A number of hormones change during and after pregnancy. Estrogen and progesterone usually decrease right after the delivery of your baby. The levels of thyroid hormone and various cortisol steroids also rapidly drop. Other factors that play a role in these mood changes include major life events and genetics. What increases the risk? If you have any of the following risks for the baby blues or postpartum depression, know what symptoms to watch out for during the postpartum period. Risk factors that may increase the likelihood of getting the baby blues or postpartum depression include:  Having a personal or family history of depression.  Having depression while being pregnant.  Having premenstrual mood issues or mood issues related to oral contraceptives.  Having a lot of life stress.  Having marital conflict.  Lacking a social support network.  Having a baby with special needs.  Having health problems, such as diabetes.  What are the signs or  symptoms? Symptoms of baby blues include:  Brief changes in mood, such as going from extreme happiness to sadness.  Decreased concentration.  Difficulty sleeping.  Crying spells, tearfulness.  Irritability.  Anxiety.  Symptoms of postpartum depression typically begin within the first month after giving birth. These symptoms include:  Difficulty sleeping or excessive sleepiness.  Marked weight loss.  Agitation.  Feelings of worthlessness.  Lack of interest in activity or food.  Postpartum psychosis is a very serious condition and can be dangerous. Fortunately, it is rare. Displaying any of the following symptoms is cause for immediate medical attention. Symptoms of postpartum psychosis include:  Hallucinations and delusions.  Bizarre or disorganized behavior.  Confusion or disorientation.  How is this diagnosed? A diagnosis is made by an evaluation of your symptoms. There are no medical or lab tests that lead to a diagnosis, but there are various questionnaires that a health care provider may use to identify those with the baby blues, postpartum depression, or psychosis. Often, a screening tool called the Edinburgh Postnatal Depression Scale is used to diagnose depression in the postpartum period. How is this treated? The baby blues usually goes away on its own in 1-2 weeks. Social support is often all that is needed. You will be encouraged to get adequate sleep and rest. Occasionally, you may be given medicines to help you sleep. Postpartum depression requires treatment because it can last several months or longer if it is not treated. Treatment may include individual or group therapy, medicine, or both to address any social, physiological, and psychological factors that may play a role in the   depression. Regular exercise, a healthy diet, rest, and social support may also be strongly recommended. Postpartum psychosis is more serious and needs treatment right away.  Hospitalization is often needed. Follow these instructions at home:  Get as much rest as you can. Nap when the baby sleeps.  Exercise regularly. Some women find yoga and walking to be beneficial.  Eat a balanced and nourishing diet.  Do little things that you enjoy. Have a cup of tea, take a bubble bath, read your favorite magazine, or listen to your favorite music.  Avoid alcohol.  Ask for help with household chores, cooking, grocery shopping, or running errands as needed. Do not try to do everything.  Talk to people close to you about how you are feeling. Get support from your partner, family members, friends, or other new moms.  Try to stay positive in how you think. Think about the things you are grateful for.  Do not spend a lot of time alone.  Only take over-the-counter or prescription medicine as directed by your health care provider.  Keep all your postpartum appointments.  Let your health care provider know if you have any concerns. Contact a health care provider if: You are having a reaction to or problems with your medicine. Get help right away if:  You have suicidal feelings.  You think you may harm the baby or someone else. This information is not intended to replace advice given to you by your health care provider. Make sure you discuss any questions you have with your health care provider. Document Released: 01/15/2004 Document Revised: 09/18/2015 Document Reviewed: 01/22/2013 Elsevier Interactive Patient Education  2017 Elsevier Inc.  

## 2017-02-25 NOTE — Progress Notes (Signed)
Subjective:    Lindsay Mcdowell is a 26 y.o. 361P1001 Caucasian female who presents for a postpartum visit. She is 6 weeks postpartum following a primary cesarean section, low transverse incision at 40.6 gestational weeks. Anesthesia: epidural. I have fully reviewed the prenatal and intrapartum course. Postpartum course has been normal. Baby's course has been normal. Baby is feeding by breast. Bleeding no bleeding. Bowel function is normal. Bladder function is normal. Patient is not sexually active. Last sexual activity: prior to delivery. Contraception method is oral progesterone-only contraceptive. Postpartum depression screening: negative. Score 5.  Last pap 07/09/16 and was LSIL with HPV present.Colposcopy 08/18/16. Needs repeat 8 wks PP.    The following portions of the patient's history were reviewed and updated as appropriate: allergies, current medications, past medical history, past surgical history and problem list.  Review of Systems Pertinent items are noted in HPI.   Vitals:   02/25/17 1037  BP: 120/72  Pulse: (!) 45  Weight: 154 lb 8 oz (70.1 kg)  Height: 5\' 4"  (1.626 m)   No LMP recorded (lmp unknown).  Objective:   General:  alert, cooperative and no distress   Breasts:  deferred, no complaints  Lungs: clear to auscultation bilaterally  Heart:  regular rate and rhythm  Abdomen: soft, nontender   Vulva: normal  Vagina: normal vagina  Cervix:  closed  Corpus: Well-involuted  Adnexa:  Non-palpable  Rectal Exam: no hemorrhoids        Assessment:   Postpartum exam 6 wks s/p LTCS breastfeeding Depression screening negative Contraception counseling -camila   Plan:  : oral progesterone-only contraceptive. Pt is tearful when discussing transition to being a new mother. She states that it has gotten better but she is anxious and that it is worse when she dose not sleep well. Discussed us of Zoloft to help with anxiety. She states that she will think about it.  She is currently  nursing the baby/baby is fussy and pt tearful - labs not done today. Follow up in: 8 weeks for colonoscopy or earlier if needed  Doreene BurkeAnnie Jeramiah Mccaughey, CNM

## 2017-03-30 ENCOUNTER — Encounter: Payer: Medicaid Other | Admitting: Obstetrics and Gynecology

## 2019-01-23 ENCOUNTER — Ambulatory Visit (INDEPENDENT_AMBULATORY_CARE_PROVIDER_SITE_OTHER): Payer: Medicaid Other | Admitting: Certified Nurse Midwife

## 2019-01-23 ENCOUNTER — Other Ambulatory Visit: Payer: Self-pay

## 2019-01-23 ENCOUNTER — Encounter: Payer: Self-pay | Admitting: Certified Nurse Midwife

## 2019-01-23 VITALS — BP 124/63 | HR 81 | Ht 64.0 in | Wt 179.6 lb

## 2019-01-23 DIAGNOSIS — N926 Irregular menstruation, unspecified: Secondary | ICD-10-CM

## 2019-01-23 DIAGNOSIS — Z3201 Encounter for pregnancy test, result positive: Secondary | ICD-10-CM

## 2019-01-23 LAB — POCT URINE PREGNANCY: Preg Test, Ur: POSITIVE — AB

## 2019-01-23 NOTE — Progress Notes (Signed)
Subjective:    Lindsay Mcdowell is a 28 y.o. female who presents for evaluation of amenorrhea. She believes she could be pregnant. Pregnancy is desired. Sexual Activity: single partner, contraception: none. Current symptoms also include: fatigue and nausea. Last period was normal.   No LMP recorded. The following portions of the patient's history were reviewed and updated as appropriate: allergies, current medications, past family history, past medical history, past social history, past surgical history and problem list.  Review of Systems Pertinent items are noted in HPI.     Objective:    There were no vitals taken for this visit. General: alert, cooperative, appears stated age, no distress and no acute distress    Lab Review Urine HCG: positive    Assessment:    Absence of menstruation.     Plan:   Positive: EDC: 08/22/19. Briefly discussed pre-natal care options. Pregnancy, Childbirth and the Newborn book given. Encouraged well-balanced diet, plenty of rest when needed, pre-natal vitamins daily and walking for exercise. Discussed self-help for nausea, avoiding OTC medications until consulting provider or pharmacist, other than Tylenol as needed, minimal caffeine (1-2 cups daily) and avoiding alcohol. She will schedule her initial  U/s and nurse visit in 1 wk. NOB in 2 wks. Feel free to call with any questions.   Philip Aspen, CNM

## 2019-01-23 NOTE — Patient Instructions (Signed)

## 2019-02-01 ENCOUNTER — Other Ambulatory Visit: Payer: Self-pay | Admitting: Certified Nurse Midwife

## 2019-02-01 ENCOUNTER — Ambulatory Visit (INDEPENDENT_AMBULATORY_CARE_PROVIDER_SITE_OTHER): Payer: Medicaid Other | Admitting: Certified Nurse Midwife

## 2019-02-01 ENCOUNTER — Other Ambulatory Visit: Payer: Self-pay

## 2019-02-01 ENCOUNTER — Ambulatory Visit (INDEPENDENT_AMBULATORY_CARE_PROVIDER_SITE_OTHER): Payer: Medicaid Other

## 2019-02-01 VITALS — BP 116/70 | HR 91 | Ht 64.0 in | Wt 179.3 lb

## 2019-02-01 DIAGNOSIS — Z202 Contact with and (suspected) exposure to infections with a predominantly sexual mode of transmission: Secondary | ICD-10-CM

## 2019-02-01 DIAGNOSIS — T7589XA Other specified effects of external causes, initial encounter: Secondary | ICD-10-CM | POA: Diagnosis not present

## 2019-02-01 DIAGNOSIS — Z3481 Encounter for supervision of other normal pregnancy, first trimester: Secondary | ICD-10-CM

## 2019-02-01 DIAGNOSIS — Z3491 Encounter for supervision of normal pregnancy, unspecified, first trimester: Secondary | ICD-10-CM | POA: Diagnosis not present

## 2019-02-01 DIAGNOSIS — R638 Other symptoms and signs concerning food and fluid intake: Secondary | ICD-10-CM

## 2019-02-01 NOTE — Progress Notes (Signed)
Lindsay Mcdowell presents for NOB nurse interview visit. Pregnancy confirmation done _9/29/20 with AT.  G-2 .  P-1001. Dating scan today reveals IUP at 83 3/7 with EDD - 08/20/2019.  Pregnancy education material explained and given. _POS for __ cats in the home. Toxoplasma ordered. NOB labs ordered. TSH/HbgA1c ordered  due to Increased BMI-Body mass index is 30.78 kg/m.  HIV labs and Drug screen were explained and ordered.  PNV encouraged. Genetic screening options discussed. Genetic testing: Unsure.  Pt may discuss with provider. Mild n./v - no meds needed at this time. Pt refuses flu vaccine and tdap. Last pap 07/19/2016- lgsil. Colpo- 08/18/2016 with [facility]NS. Did not get colpo pp. Pap needed. Pt. To follow up with provider in _1_ weeks for NOB physical.  All questions answered.

## 2019-02-01 NOTE — Patient Instructions (Signed)
Common M Morning Sickness  Morning sickness is when you feel sick to your stomach (nauseous) during pregnancy. You may feel sick to your stomach and throw up (vomit). You may feel sick in the morning, but you can feel this way at any time of day. Some women feel very sick to their stomach and cannot stop throwing up (hyperemesis gravidarum). Follow these instructions at home: Medicines  Take over-the-counter and prescription medicines only as told by your doctor. Do not take any medicines until you talk with your doctor about them first. WHAT OB PATIENTS CAN EXPECT  Confirmation of pregnancy and ultrasound ordered if medically indicated-[redacted] weeks gestation New OB (NOB) intake with nurse and New OB (NOB) labs- [redacted] weeks gestation New OB (NOB) physical examination with provider- 11/[redacted] weeks gestation Flu vaccine-[redacted] weeks gestation Anatomy scan-[redacted] weeks gestation Glucose tolerance test, blood work to test for anemia, T-dap vaccine-[redacted] weeks gestation Vaginal swabs/cultures-STD/Group B strep-[redacted] weeks gestation Appointments every 4 weeks until 28 weeks Every 2 weeks from 28 weeks until 36 weeks Weekly visits from 36 weeks until delivery     If you feel sick to your stomach after taking prenatal vitamins, take them at night or with a snack.  Eat protein when you need a snack. Nuts, yogurt, and cheese are good choices.  Drink fluids throughout the day.  Try ginger ale made with real ginger, ginger tea made from fresh grated ginger, or ginger candies. General instructions  Do not use any products that have nicotine or tobacco in them, such as cigarettes and e-cigarettes. If you need help quitting, ask your doctor.  Use an air purifier to keep the air in your house free of smells.  Get lots of fresh air.  Try to avoid smells that make you feel sick.  Try: ? Wearing a bracelet that is used for seasickness (acupressure wristband). ? Going to a doctor who puts thin needles into certain body  points (acupuncture) to improve how you feel. Contact a doctor if:  You need medicine to feel better.  You feel dizzy or light-headed.  You are losing weight. Get help right away if:  You feel very sick to your stomach and cannot stop throwing up.  You pass out (faint).  You have very bad pain in your belly. Summary  Morning sickness is when you feel sick to your stomach (nauseous) during pregnancy.  You may feel sick in the morning, but you can feel this way at any time of day.  Making some changes to what you eat may help your symptoms go away. This information is not intended to replace advice given to you by your health care provider. Make sure you discuss any questions you have with your health care provider. Document Released: 05/20/2004 Document Revised: 03/25/2017 Document Reviewed: 05/13/2016 Elsevier Patient Education  2020 [employer].  How a Baby Grows During Pregnancy  Pregnancy begins when a female's sperm enters a female's egg (fertilization). Fertilization usually happens in one of the tubes (fallopian tubes) that connect the ovaries to the womb (uterus). The fertilized egg moves down the fallopian tube to the uterus. Once it reaches the uterus, it implants into the lining of the uterus and begins to grow. For the first 10 weeks, the fertilized egg is called an embryo. After 10 weeks, it is called a fetus. As the fetus continues to grow, it receives oxygen and nutrients through tissue (placenta) that grows to support the developing baby. The placenta is the life support system for  the baby. It provides oxygen and nutrition and removes waste. Learning as much as you can about your pregnancy and how your baby is developing can help you enjoy the experience. It can also make you aware of when there might be a problem and when to ask questions. How long does a typical pregnancy last? A pregnancy usually lasts 280 days, or about 40 weeks. Pregnancy is divided into three  periods of growth, also called trimesters:  First trimester: 0-12 weeks.  Second trimester: 13-27 weeks.  Third trimester: 28-40 weeks. The day when your baby is ready to be born (full term) is your estimated date of delivery. How does my baby develop month by month? First month  The fertilized egg attaches to the inside of the uterus.  Some cells will form the placenta. Others will form the fetus.  The arms, legs, brain, spinal cord, lungs, and heart begin to develop.  At the end of the first month, the heart begins to beat. Second month  The bones, inner ear, eyelids, hands, and feet form.  The genitals develop.  By the end of 8 weeks, all major organs are developing. Third month  All of the internal organs are forming.  Teeth develop below the gums.  Bones and muscles begin to grow. The spine can flex.  The skin is transparent.  Fingernails and toenails begin to form.  Arms and legs continue to grow longer, and hands and feet develop.  The fetus is about 3 inches (7.6 cm) long. Fourth month  The placenta is completely formed.  The external sex organs, neck, outer ear, eyebrows, eyelids, and fingernails are formed.  The fetus can hear, swallow, and move its arms and legs.  The kidneys begin to produce urine.  The skin is covered with a white, waxy coating (vernix) and very fine hair (lanugo). Fifth month  The fetus moves around more and can be felt for the first time (quickening).  The fetus starts to sleep and wake up and may begin to suck its finger.  The nails grow to the end of the fingers.  The organ in the digestive system that makes bile (gallbladder) functions and helps to digest nutrients.  If your baby is a girl, eggs are present in her ovaries. If your baby is a boy, testicles start to move down into his scrotum. Sixth month  The lungs are formed.  The eyes open. The brain continues to develop.  Your baby has fingerprints and toe  prints. Your baby's hair grows thicker.  At the end of the second trimester, the fetus is about 9 inches (22.9 cm) long. Seventh month  The fetus kicks and stretches.  The eyes are developed enough to sense changes in light.  The hands can make a grasping motion.  The fetus responds to sound. Eighth month  All organs and body systems are fully developed and functioning.  Bones harden, and taste buds develop. The fetus may hiccup.  Certain areas of the brain are still developing. The skull remains soft. Ninth month  The fetus gains about  lb (0.23 kg) each week.  The lungs are fully developed.  Patterns of sleep develop.  The fetus's head typically moves into a head-down position (vertex) in the uterus to prepare for birth.  The fetus weighs 6-9 lb (2.72-4.08 kg) and is 19-20 inches (48.26-50.8 cm) long. What can I do to have a healthy pregnancy and help my baby develop? General instructions  Take prenatal vitamins as directed  by your health care provider. These include vitamins such as folic acid, iron, calcium, and vitamin D. They are important for healthy development.  Take medicines only as directed by your health care provider. Read labels and ask a pharmacist or your health care provider whether over-the-counter medicines, supplements, and prescription drugs are safe to take during pregnancy.  Keep all follow-up visits as directed by your health care provider. This is important. Follow-up visits include prenatal care and screening tests. How do I know if my baby is developing well? At each prenatal visit, your health care provider will do several different tests to check on your health and keep track of your baby's development. These include:  Fundal height and position. ? Your health care provider will measure your growing belly from your pubic bone to the top of the uterus using a tape measure. ? Your health care provider will also feel your belly to determine your  baby's position.  Heartbeat. ? An ultrasound in the first trimester can confirm pregnancy and show a heartbeat, depending on how far along you are. ? Your health care provider will check your baby's heart rate at every prenatal visit.  Second trimester ultrasound. ? This ultrasound checks your baby's development. It also may show your baby's gender. What should I do if I have concerns about my baby's development? Always talk with your health care provider about any concerns that you may have about your pregnancy and your baby. Summary  A pregnancy usually lasts 280 days, or about 40 weeks. Pregnancy is divided into three periods of growth, also called trimesters.  Your health care provider will monitor your baby's growth and development throughout your pregnancy.  Follow your health care provider's recommendations about taking prenatal vitamins and medicines during your pregnancy.  Talk with your health care provider if you have any concerns about your pregnancy or your developing baby. This information is not intended to replace advice given to you by your health care provider. Make sure you discuss any questions you have with your health care provider. Document Released: 09/29/2007 Document Revised: 08/03/2018 Document Reviewed: 02/23/2017 Elsevier Patient Education  2020 [place] of Pregnancy The first trimester of pregnancy is from week 1 until the end of week 13 (months 1 through 3). A week after a sperm fertilizes an egg, the egg will implant on the wall of the uterus. This embryo will begin to develop into a baby. Genes from you and your partner will form the baby. The female genes will determine whether the baby will be a boy or a girl. At 6-8 weeks, the eyes and face will be formed, and the heartbeat can be seen on ultrasound. At the end of 12 weeks, all the baby's organs will be formed. Now that you are pregnant, you will want to do everything you can to have a  healthy baby. Two of the most important things are to get good prenatal care and to follow your health care provider's instructions. Prenatal care is all the medical care you receive before the baby's birth. This care will help prevent, find, and treat any problems during the pregnancy and childbirth. Body changes during your first trimester Your body goes through many changes during pregnancy. The changes vary from woman to woman.  You may gain or lose a couple of pounds at first.  You may feel sick to your stomach (nauseous) and you may throw up (vomit). If the vomiting is uncontrollable, call your health care  provider.  You may tire easily.  You may develop headaches that can be relieved by medicines. All medicines should be approved by your health care provider.  You may urinate more often. Painful urination may mean you have a bladder infection.  You may develop heartburn as a result of your pregnancy.  You may develop constipation because certain hormones are causing the muscles that push stool through your intestines to slow down.  You may develop hemorrhoids or swollen veins (varicose veins).  Your breasts may begin to grow larger and become tender. Your nipples may stick out more, and the tissue that surrounds them (areola) may become darker.  Your gums may bleed and may be sensitive to brushing and flossing.  Dark spots or blotches (chloasma, mask of pregnancy) may develop on your face. This will likely fade after the baby is born.  Your menstrual periods will stop.  You may have a loss of appetite.  You may develop cravings for certain kinds of food.  You may have changes in your emotions from day to day, such as being excited to be pregnant or being concerned that something may go wrong with the pregnancy and baby.  You may have more vivid and strange dreams.  You may have changes in your hair. These can include thickening of your hair, rapid growth, and changes in  texture. Some women also have hair loss during or after pregnancy, or hair that feels dry or thin. Your hair will most likely return to normal after your baby is born. What to expect at prenatal visits During a routine prenatal visit:  You will be weighed to make sure you and the baby are growing normally.  Your blood pressure will be taken.  Your abdomen will be measured to track your baby's growth.  The fetal heartbeat will be listened to between weeks 10 and 14 of your pregnancy.  Test results from any previous visits will be discussed. Your health care provider may ask you:  How you are feeling.  If you are feeling the baby move.  If you have had any abnormal symptoms, such as leaking fluid, bleeding, severe headaches, or abdominal cramping.  If you are using any tobacco products, including cigarettes, chewing tobacco, and electronic cigarettes.  If you have any questions. Other tests that may be performed during your first trimester include:  Blood tests to find your blood type and to check for the presence of any previous infections. The tests will also be used to check for low iron levels (anemia) and protein on red blood cells (Rh antibodies). Depending on your risk factors, or if you previously had diabetes during pregnancy, you may have tests to check for high blood sugar that affects pregnant women (gestational diabetes).  Urine tests to check for infections, diabetes, or protein in the urine.  An ultrasound to confirm the proper growth and development of the baby.  Fetal screens for spinal cord problems (spina bifida) and Down syndrome.  HIV (human immunodeficiency virus) testing. Routine prenatal testing includes screening for HIV, unless you choose not to have this test.  You may need other tests to make sure you and the baby are doing well. Follow these instructions at home: Medicines  Follow your health care provider's instructions regarding medicine use.  Specific medicines may be either safe or unsafe to take during pregnancy.  Take a prenatal vitamin that contains at least 600 micrograms (mcg) of folic acid.  If you develop constipation, try taking a stool  softener if your health care provider approves. Eating and drinking   Eat a balanced diet that includes fresh fruits and vegetables, whole grains, good sources of protein such as meat, eggs, or tofu, and low-fat dairy. Your health care provider will help you determine the amount of weight gain that is right for you.  Avoid raw meat and uncooked cheese. These carry germs that can cause birth defects in the baby.  Eating four or five small meals rather than three large meals a day may help relieve nausea and vomiting. If you start to feel nauseous, eating a few soda crackers can be helpful. Drinking liquids between meals, instead of during meals, also seems to help ease nausea and vomiting.  Limit foods that are high in fat and processed sugars, such as fried and sweet foods.  To prevent constipation: ? Eat foods that are high in fiber, such as fresh fruits and vegetables, whole grains, and beans. ? Drink enough fluid to keep your urine clear or pale yellow. Activity  Exercise only as directed by your health care provider. Most women can continue their usual exercise routine during pregnancy. Try to exercise for 30 minutes at least 5 days a week. Exercising will help you: ? Control your weight. ? Stay in shape. ? Be prepared for labor and delivery.  Experiencing pain or cramping in the lower abdomen or lower back is a good sign that you should stop exercising. Check with your health care provider before continuing with normal exercises.  Try to avoid standing for long periods of time. Move your legs often if you must stand in one place for a long time.  Avoid heavy lifting.  Wear low-heeled shoes and practice good posture.  You may continue to have sex unless your health care  provider tells you not to. Relieving pain and discomfort  Wear a good support bra to relieve breast tenderness.  Take warm sitz baths to soothe any pain or discomfort caused by hemorrhoids. Use hemorrhoid cream if your health care provider approves.  Rest with your legs elevated if you have leg cramps or low back pain.  If you develop varicose veins in your legs, wear support hose. Elevate your feet for 15 minutes, 3-4 times a day. Limit salt in your diet. Prenatal care  Schedule your prenatal visits by the twelfth week of pregnancy. They are usually scheduled monthly at first, then more often in the last 2 months before delivery.  Write down your questions. Take them to your prenatal visits.  Keep all your prenatal visits as told by your health care provider. This is important. Safety  Wear your seat belt at all times when driving.  Make a list of emergency phone numbers, including numbers for family, friends, the hospital, and police and fire departments. General instructions  Ask your health care provider for a referral to a local prenatal education class. Begin classes no later than the beginning of month 6 of your pregnancy.  Ask for help if you have counseling or nutritional needs during pregnancy. Your health care provider can offer advice or refer you to specialists for help with various needs.  Do not use hot tubs, steam rooms, or saunas.  Do not douche or use tampons or scented sanitary pads.  Do not cross your legs for long periods of time.  Avoid cat litter boxes and soil used by cats. These carry germs that can cause birth defects in the baby and possibly loss of the fetus by miscarriage  or stillbirth.  Avoid all smoking, herbs, alcohol, and medicines not prescribed by your health care provider. Chemicals in these products affect the formation and growth of the baby.  Do not use any products that contain nicotine or tobacco, such as cigarettes and e-cigarettes. If  you need help quitting, ask your health care provider. You may receive counseling support and other resources to help you quit.  Schedule a dentist appointment. At home, brush your teeth with a soft toothbrush and be gentle when you floss. Contact a health care provider if:  You have dizziness.  You have mild pelvic cramps, pelvic pressure, or nagging pain in the abdominal area.  You have persistent nausea, vomiting, or diarrhea.  You have a bad smelling vaginal discharge.  You have pain when you urinate.  You notice increased swelling in your face, hands, legs, or ankles.  You are exposed to fifth disease or chickenpox.  You are exposed to [place] measles (rubella) and have never had it. Get help right away if:  You have a fever.  You are leaking fluid from your vagina.  You have spotting or bleeding from your vagina.  You have severe abdominal cramping or pain.  You have rapid weight gain or loss.  You vomit blood or material that looks like coffee grounds.  You develop a severe headache.  You have shortness of breath.  You have any kind of trauma, such as from a fall or a car accident. Summary  The first trimester of pregnancy is from week 1 until the end of week 13 (months 1 through 3).  Your body goes through many changes during pregnancy. The changes vary from woman to woman.  You will have routine prenatal visits. During those visits, your health care provider will examine you, discuss any test results you may have, and talk with you about how you are feeling. This information is not intended to replace advice given to you by your health care provider. Make sure you discuss any questions you have with your health care provider. Document Released: 04/06/2001 Document Revised: 03/25/2017 Document Reviewed: 03/24/2016 Elsevier Patient Education  2020 [employer]. Commonly Asked Questions During Pregnancy  Cats: A parasite can be excreted in cat feces.  To avoid  exposure you need to have another person empty the little box.  If you must empty the litter box you will need to wear gloves.  Wash your hands after handling your cat.  This parasite can also be found in raw or undercooked meat so this should also be avoided.  Colds, Sore Throats, Flu: Please check your medication sheet to see what you can take for symptoms.  If your symptoms are unrelieved by these medications please call the office.  Dental Work: Most any dental work [employer] recommends is permitted.  X-rays should only be taken during the first trimester if absolutely necessary.  Your abdomen should be shielded with a lead apron during all x-rays.  Please notify your provider prior to receiving any x-rays.  Novocaine is fine; gas is not recommended.  If your dentist requires a note from [place] prior to dental work please call the office and we will provide one for you.  Exercise: Exercise is an important part of staying healthy during your pregnancy.  You may continue most exercises you were accustomed to prior to pregnancy.  Later in your pregnancy you will most likely notice you have difficulty with activities requiring balance like riding a bicycle.  It is important that  you listen to your body and avoid activities that put you at a higher risk of falling.  Adequate rest and staying well hydrated are a must!  If you have questions about the safety of specific activities ask your provider.    Exposure to Children with illness: Try to avoid obvious exposure; report any symptoms to [place] when noted,  If you have chicken pos, red measles or mumps, you should be immune to these diseases.   Please do not take any vaccines while pregnant unless you have checked with your OB provider.  Fetal Movement: After 28 weeks we recommend you do "kick counts" twice daily.  Lie or sit down in a calm quiet environment and count your baby movements "kicks".  You should feel your baby at least 10 times per hour.  If you  have not felt 10 kicks within the first hour get up, walk around and have something sweet to eat or drink then repeat for an additional hour.  If count remains less than 10 per hour notify your provider.  Fumigating: Follow your pest control agent's advice as to how long to stay out of your home.  Ventilate the area well before re-entering.  Hemorrhoids:   Most over-the-counter preparations can be used during pregnancy.  Check your medication to see what is safe to use.  It is important to use a stool softener or fiber in your diet and to drink lots of liquids.  If hemorrhoids seem to be getting worse please call the office.   Hot Tubs:  Hot tubs Jacuzzis and saunas are not recommended while pregnant.  These increase your internal body temperature and should be avoided.  Intercourse:  Sexual intercourse is safe during pregnancy as long as you are comfortable, unless otherwise advised by your provider.  Spotting may occur after intercourse; report any bright red bleeding that is heavier than spotting.  Labor:  If you know that you are in labor, please go to the hospital.  If you are unsure, please call the office and let us help you decide what to do.  Lifting, straining, etc:  If your job requires heavy lifting or straining please check with your provider for any limitations.  Generally, you should not lift items heavier than that you can lift simply with your hands and arms (no back muscles)  Painting:  Paint fumes do not harm your pregnancy, but may make you ill and should be avoided if possible.  Latex or water based paints have less odor than oils.  Use adequate ventilation while painting.  Permanents & Hair Color:  Chemicals in hair dyes are not recommended as they cause increase hair dryness which can increase hair loss during pregnancy.  " Highlighting" and permanents are allowed.  Dye may be absorbed differently and permanents may not hold as well during pregnancy.  Sunbathing:  Use a  sunscreen, as skin burns easily during pregnancy.  Drink plenty of fluids; avoid over heating.  Tanning Beds:  Because their possible side effects are still unknown, tanning beds are not recommended.  Ultrasound Scans:  Routine ultrasounds are performed at approximately 20 weeks.  You will be able to see your baby's general anatomy an if you would like to know the gender this can usually be determined as well.  If it is questionable when you conceived you may also receive an ultrasound early in your pregnancy for dating purposes.  Otherwise ultrasound exams are not routinely performed unless there is a medical necessity.  Although you can request a scan we ask that you pay for it when conducted because insurance does not cover " patient request" scans.  Work: If your pregnancy proceeds without complications you may work until your due date, unless your physician or employer advises otherwise.  Round Ligament Pain/Pelvic Discomfort:  Sharp, shooting pains not associated with bleeding are fairly common, usually occurring in the second trimester of pregnancy.  They tend to be worse when standing up or when you remain standing for long periods of time.  These are the result of pressure of certain pelvic ligaments called "round ligaments".  Rest, Tylenol and heat seem to be the most effective relief.  As the womb and fetus grow, they rise out of the pelvis and the discomfort improves.  Please notify the office if your pain seems different than that described.  It may represent a more serious condition.  mucil         Miralax AVOID:        Senakot   Accutane    Cough:  Retin-A       Cough Drops  Tetracycline      Phenergan w/ Codeine if Rx  Minocycline      Robitussin (Plain & DM)  Antibiotics:     Crabs/Lice:  Ceclor       RID  Cephalosporins    AVOID:  E-Mycins      Kwell  Keflex  Macrobid/Macrodantin   Diarrhea:  Penicillin      Kao-Pectate  Zithromax      Imodium AD         PUSH  FLUIDS AVOID:       Cipro     Fever:  Tetracycline      Tylenol (Regular or Extra  Minocycline       Strength)  Levaquin      Extra Strength-Do not          Exceed 8 tabs/24 hrs Caffeine:        <216m/day (equiv. To 1 cup of coffee or  approx. 3 12 oz sodas)         Gas: Cold/Hayfever:       Gas-X  Benadryl      Mylicon  Claritin       Phazyme  **Claritin-D        Chlor-Trimeton    Headaches:  Dimetapp      ASA-Free Excedrin  Drixoral-Non-Drowsy     Cold Compress  Mucinex (Guaifenasin)     Tylenol (Regular or Extra  Sudafed/Sudafed-12 Hour     Strength)  **Sudafed PE Pseudoephedrine   Tylenol Cold & Sinus     Vicks Vapor Rub  Zyrtec  **AVOID if Problems With Blood Pressure         Heartburn: Avoid lying down for at least 1 hour after meals  Aciphex      Maalox     Rash:  Milk of Magnesia     Benadryl    Mylanta       1% Hydrocortisone Cream  Pepcid  Pepcid Complete   Sleep Aids:  Prevacid      Ambien   Prilosec       Benadryl  Rolaids       Chamomile Tea  Tums (Limit 4/day)     Unisom  Zantac       Tylenol PM         Warm milk-add vanilla or  Hemorrhoids:       Sugar for taste  Anusol/Anusol H.C.  (RX: Analapram 2.5%)  Sugar Substitutes:  Hydrocortisone OTC     Ok in moderation  Preparation H      Tucks        Vaseline lotion applied to tissue with wiping    Herpes:     Throat:  Acyclovir      Oragel  Famvir  Valtrex     Vaccines:         Flu Shot Leg Cramps:       *Gardasil  Benadryl      Hepatitis A         Hepatitis B Nasal Spray:       Pneumovax  Saline Nasal Spray     Polio Booster         Tetanus Nausea:       Tuberculosis test or PPD  Vitamin B6 25 mg TID   AVOID:    Dramamine      *Gardasil  Emetrol       Live Poliovirus  Ginger Root 250 mg QID    MMR (measles, mumps &  High Complex Carbs @ Bedtime    rebella)  Sea Bands-Accupressure    Varicella (Chickenpox)  Unisom 1/2 tab TID     *No known complications           If received  before Pain:         Known pregnancy;   Darvocet       Resume series after  Lortab        Delivery  Percocet    Yeast:   Tramadol      Femstat  Tylenol 3      Gyne-lotrimin  Ultram       Monistat  Vicodin           MISC:         All Sunscreens           Hair Coloring/highlights          Insect Repellant's          (Including DEET)         [facility]  [place], [place], [place] [address]  Phone: [phone]   [facility] (second location)  [address] [place], [place] [address]  Phone: [phone]   [facility] [place] [place], [place], [place] [address] Phone: [phone]   [place] [address] [place], [place] [address]  Phone: [phone]

## 2019-02-02 LAB — ABO AND RH: Rh Factor: POSITIVE

## 2019-02-02 LAB — TOXOPLASMA ANTIBODIES- IGG AND  IGM
Toxoplasma Antibody- IgM: <3 AU/mL (ref 0.0–7.9)
Toxoplasma IgG Ratio: <3 IU/mL (ref 0.0–7.1)

## 2019-02-02 LAB — URINALYSIS, ROUTINE W REFLEX MICROSCOPIC
Bilirubin, UA: NEGATIVE
Glucose, UA: NEGATIVE
Leukocytes,UA: NEGATIVE
Nitrite, UA: NEGATIVE
Protein,UA: NEGATIVE
RBC, UA: NEGATIVE
Specific Gravity, UA: 1.024 (ref 1.005–1.030)
Urobilinogen, Ur: 0.2 mg/dL (ref 0.2–1.0)
pH, UA: 5.5 (ref 5.0–7.5)

## 2019-02-02 LAB — RPR: RPR Ser Ql: NONREACTIVE

## 2019-02-02 LAB — RUBELLA SCREEN: Rubella Antibodies, IGG: <0.9 index — ABNORMAL LOW (ref >0.99)

## 2019-02-02 LAB — ANTIBODY SCREEN: Antibody Screen: NEGATIVE

## 2019-02-02 LAB — HEPATITIS B SURFACE ANTIGEN: Hepatitis B Surface Ag: NEGATIVE

## 2019-02-02 LAB — HIV ANTIBODY (ROUTINE TESTING W REFLEX): HIV Screen 4th Generation wRfx: NONREACTIVE

## 2019-02-02 LAB — VARICELLA ZOSTER ANTIBODY, IGG: Varicella zoster IgG: 1634 index (ref >165)

## 2019-02-03 LAB — GC/CHLAMYDIA PROBE AMP
Chlamydia trachomatis, NAA: NEGATIVE
Neisseria Gonorrhoeae by PCR: NEGATIVE

## 2019-02-03 LAB — URINE CULTURE, OB REFLEX

## 2019-02-03 LAB — CULTURE, OB URINE

## 2019-02-05 LAB — MONITOR DRUG PROFILE 14(MW)
Amphetamine Scrn, Ur: NEGATIVE ng/mL
BARBITURATE SCREEN URINE: NEGATIVE ng/mL
BENZODIAZEPINE SCREEN, URINE: NEGATIVE ng/mL
Buprenorphine, Urine: NEGATIVE ng/mL
Cocaine (Metab) Scrn, Ur: NEGATIVE ng/mL
Creatinine(Crt), U: 138.5 mg/dL (ref 20.0–300.0)
Fentanyl, Urine: NEGATIVE pg/mL
Meperidine Screen, Urine: NEGATIVE ng/mL
Methadone Screen, Urine: NEGATIVE ng/mL
OXYCODONE+OXYMORPHONE UR QL SCN: NEGATIVE ng/mL
Opiate Scrn, Ur: NEGATIVE ng/mL
Ph of Urine: 5.5 (ref 4.5–8.9)
Phencyclidine Qn, Ur: NEGATIVE ng/mL
Propoxyphene Scrn, Ur: NEGATIVE ng/mL
SPECIFIC GRAVITY: 1.023
Tramadol Screen, Urine: NEGATIVE ng/mL

## 2019-02-05 LAB — CANNABINOID (GC/MS), URINE
Cannabinoid: POSITIVE — AB
Carboxy THC (GC/MS): 21 ng/mL

## 2019-02-05 LAB — NICOTINE SCREEN, URINE: Cotinine Ql Scrn, Ur: NEGATIVE ng/mL

## 2019-02-07 ENCOUNTER — Other Ambulatory Visit: Payer: Self-pay

## 2019-02-07 ENCOUNTER — Encounter: Payer: Self-pay | Admitting: Certified Nurse Midwife

## 2019-02-07 ENCOUNTER — Ambulatory Visit (INDEPENDENT_AMBULATORY_CARE_PROVIDER_SITE_OTHER): Payer: Medicaid Other | Admitting: Certified Nurse Midwife

## 2019-02-07 VITALS — BP 121/75 | HR 78 | Wt 180.1 lb

## 2019-02-07 DIAGNOSIS — Z3481 Encounter for supervision of other normal pregnancy, first trimester: Secondary | ICD-10-CM

## 2019-02-07 DIAGNOSIS — O99891 Other specified diseases and conditions complicating pregnancy: Secondary | ICD-10-CM

## 2019-02-07 DIAGNOSIS — Z3482 Encounter for supervision of other normal pregnancy, second trimester: Secondary | ICD-10-CM | POA: Diagnosis not present

## 2019-02-07 DIAGNOSIS — Z8659 Personal history of other mental and behavioral disorders: Secondary | ICD-10-CM | POA: Insufficient documentation

## 2019-02-07 NOTE — Patient Instructions (Signed)

## 2019-02-07 NOTE — Progress Notes (Signed)
NEW OB HISTORY AND PHYSICAL  SUBJECTIVE:       Lindsay Mcdowell is a 28 y.o. G60P1001 female, Patient's last menstrual period was 11/15/2018 (exact date)., Estimated Date of Delivery: 08/22/19, [redacted]w[redacted]d, presents today for establishment of Prenatal Care. She has nausea , samples of bonjesta given.    Gynecologic History Patient's last menstrual period was 11/15/2018 (exact date). Normal Contraception: none Last Pap:07/09/2016. Results were: abnormal, LSIL , colonoscopy 08/18/16. Was supposed to have repeat 4 wks after ppv, pt did not return. She declines pap smear today. Discussed recommendations and possible risks. She verbalizes understanding and declines State she will have it done 12 wks pp.   Obstetric History OB History  Gravida Para Term Preterm AB Living  2 1 1     1   SAB TAB Ectopic Multiple Live Births        0 1    # Outcome Date GA Lbr Len/2nd Weight Sex Delivery Anes PTL Lv  2 Current           1 Term 01/14/17 [redacted]w[redacted]d  8 lb 10.3 oz (3.92 kg) M CS-LTranv EPI  LIV    No past medical history on file.  Past Surgical History:  Procedure Laterality Date  . CESAREAN SECTION N/A 01/14/2017   Procedure: CESAREAN SECTION;  Surgeon: Brayton Mars, MD;  Location: [facility] ORS;  Service: Obstetrics;  Laterality: N/A;  . none      Current Outpatient Medications on File Prior to Visit  Medication Sig Dispense Refill  . Prenatal Vit-Fe Fumarate-FA (PRENATAL VITAMINS) 28-0.8 MG TABS Take by mouth.     No current facility-administered medications on file prior to visit.     Allergies  Allergen Reactions  . Codeine Itching    Pt unsure of reaction, fhx of allergy    Social History   Socioeconomic History  . Marital status: Single    Spouse name: Not on file  . Number of children: Not on file  . Years of education: Not on file  . Highest education level: Not on file  Occupational History  . Not on file  Social Needs  . Financial resource strain: Not on file  . Food insecurity     Worry: Not on file    Inability: Not on file  . Transportation needs    Medical: Not on file    Non-medical: Not on file  Tobacco Use  . Smoking status: Never Smoker  . Smokeless tobacco: Never Used  Substance and Sexual Activity  . Alcohol use: No    Comment: not since pregnant  . Drug use: No  . Sexual activity: Yes    Partners: Male    Birth control/protection: None  Lifestyle  . Physical activity    Days per week: Not on file    Minutes per session: Not on file  . Stress: Not on file  Relationships  . Social Herbalist on phone: Not on file    Gets together: Not on file    Attends religious service: Not on file    Active member of club or organization: Not on file    Attends meetings of clubs or organizations: Not on file    Relationship status: Not on file  . Intimate partner violence    Fear of current or ex partner: Not on file    Emotionally abused: Not on file    Physically abused: Not on file    Forced sexual activity: Not on file  Other  Topics Concern  . Not on file  Social History Narrative  . Not on file    Family History  Problem Relation Age of Onset  . Hypertension Mother   . Migraines Mother   . Asthma Maternal Grandmother   . Diabetes Maternal Grandmother   . Rashes / Skin problems Maternal Grandmother        eczema    The following portions of the patient's history were reviewed and updated as appropriate: allergies, current medications, past OB history, past medical history, past surgical history, past family history, past social history, and problem list.    OBJECTIVE: Initial Physical Exam (New OB)  GENERAL APPEARANCE: alert, well appearing, in no apparent distress, overweight HEAD: normocephalic, atraumatic MOUTH: mucous membranes moist, pharynx normal without lesions THYROID: no thyromegaly or masses present BREASTS: no masses noted, no significant tenderness, no palpable axillary nodes, no skin changes LUNGS: clear to  auscultation, no wheezes, rales or rhonchi, symmetric air entry HEART: regular rate and rhythm, no murmurs ABDOMEN: soft, nontender, nondistended, no abnormal masses, no epigastric pain EXTREMITIES: no redness or tenderness in the calves or thighs, no edema, no limitation in range of motion SKIN: normal coloration and turgor, no rashes LYMPH NODES: no adenopathy palpable NEUROLOGIC: alert, oriented, normal speech, no focal findings or movement disorder noted  PELVIC EXAM EXTERNAL GENITALIA: normal appearing vulva with no masses, tenderness or lesions VAGINA: no abnormal discharge or lesions CERVIX: no lesions or cervical motion tenderness, declines pap smear UTERUS: gravid ADNEXA: no masses palpable and nontender OB EXAM PELVIMETRY: appears adequate RECTUM: exam not indicated  ASSESSMENT: Normal pregnancy  PLAN: New OB counseling: The patient has been given an overview regarding routine prenatal care. Recommendations regarding diet, weight gain, and exercise in pregnancy were given. Prenatal testing, optional genetic testing,carrier screening, and ultrasound use in pregnancy were reviewed. Benefits of Breast Feeding were discussed. The patient is encouraged to consider nursing her baby post partum. Follow up 4 wks for ROB with Melody .   [name], CNM

## 2019-02-08 LAB — CBC
Hematocrit: 39.6 % (ref 34.0–46.6)
Hemoglobin: 13 g/dL (ref 11.1–15.9)
MCH: 30.6 pg (ref 26.6–33.0)
MCHC: 32.8 g/dL (ref 31.5–35.7)
MCV: 93 fL (ref 79–97)
Platelets: 338 10*3/uL (ref 150–450)
RBC: 4.25 x10E6/uL (ref 3.77–5.28)
RDW: 12.5 % (ref 11.7–15.4)
WBC: 10.1 10*3/uL (ref 3.4–10.8)

## 2019-02-09 ENCOUNTER — Encounter: Payer: Medicaid Other | Admitting: Certified Nurse Midwife

## 2019-02-24 LAB — TSH: TSH: 1.4 u[IU]/mL (ref 0.450–4.500)

## 2019-02-24 LAB — HEMOGLOBIN A1C
Est. average glucose Bld gHb Est-mCnc: 108 mg/dL
Hgb A1c MFr Bld: 5.4 % (ref 4.8–5.6)

## 2019-02-24 LAB — SPECIMEN STATUS REPORT

## 2019-03-07 ENCOUNTER — Encounter: Payer: Medicaid Other | Admitting: Certified Nurse Midwife

## 2019-03-07 ENCOUNTER — Encounter: Payer: Medicaid Other | Admitting: Obstetrics and Gynecology

## 2019-03-09 ENCOUNTER — Other Ambulatory Visit: Payer: Self-pay

## 2019-03-09 ENCOUNTER — Ambulatory Visit (INDEPENDENT_AMBULATORY_CARE_PROVIDER_SITE_OTHER): Payer: Medicaid Other | Admitting: Certified Nurse Midwife

## 2019-03-09 ENCOUNTER — Encounter: Payer: Self-pay | Admitting: Certified Nurse Midwife

## 2019-03-09 VITALS — BP 115/69 | HR 76 | Wt 184.0 lb

## 2019-03-09 DIAGNOSIS — Z3481 Encounter for supervision of other normal pregnancy, first trimester: Secondary | ICD-10-CM

## 2019-03-09 NOTE — Progress Notes (Signed)
ROB doing well. Feels movement. No longer have nausea. Discussed anatomy u/s next visit. She verbalzes and agrees to plan. Follow up 4 wk.   Philip Aspen, CNM

## 2019-03-09 NOTE — Patient Instructions (Signed)

## 2019-04-10 ENCOUNTER — Ambulatory Visit (INDEPENDENT_AMBULATORY_CARE_PROVIDER_SITE_OTHER): Payer: Medicaid Other | Admitting: Certified Nurse Midwife

## 2019-04-10 ENCOUNTER — Other Ambulatory Visit: Payer: Self-pay

## 2019-04-10 ENCOUNTER — Ambulatory Visit (INDEPENDENT_AMBULATORY_CARE_PROVIDER_SITE_OTHER): Payer: Medicaid Other

## 2019-04-10 ENCOUNTER — Encounter: Payer: Self-pay | Admitting: Certified Nurse Midwife

## 2019-04-10 VITALS — BP 116/68 | HR 75 | Wt 186.1 lb

## 2019-04-10 DIAGNOSIS — Z3481 Encounter for supervision of other normal pregnancy, first trimester: Secondary | ICD-10-CM

## 2019-04-10 DIAGNOSIS — Z3482 Encounter for supervision of other normal pregnancy, second trimester: Secondary | ICD-10-CM

## 2019-04-10 NOTE — Patient Instructions (Signed)

## 2019-04-10 NOTE — Progress Notes (Signed)
ROB doing well. Anatomy u/s today , reviewed. ( see below). Feels movement. Round ligament pain reviewed. Self help measures discussed. Follow up 4 wks.   Philip Aspen, [place] M  Patient Name: Lindsay Mcdowell DOB: 04/17/1991 MRN: [id number] ULTRASOUND REPORT  Location: [facility] Date of Service: 04/10/2019   Indications:Anatomy Ultrasound Findings:  Lindsay Mcdowell intrauterine pregnancy is visualized with FHR at 155 BPM. Biometrics give an (U/S) Gestational age of [redacted]w[redacted]d and an (U/S) EDD of 08/14/2019; this correlates with the clinically established Estimated Date of Delivery: 08/22/19  Fetal presentation is Variable.  EFW: 458 g ( 1 lb 0 oz). Fetal Percentile 78 Placenta: posterior. Grade: 1 AFI: subjectively normal.  Anatomic survey is complete and normal; Gender - female.    Right Ovary is normal in appearance. Left Ovary is normal appearance. Survey of the adnexa demonstrates no adnexal masses. There is no free peritoneal fluid in the cul de sac.  Impression: 1. [redacted]w[redacted]d Viable Singleton Intrauterine pregnancy by U/S. 2. (U/S) EDD is consistent with Clinically established Estimated Date of Delivery: 08/22/19 . 3. Normal Anatomy Scan  Recommendations: 1.Clinical correlation with the patient's History and Physical Exam.   Jenine M. Albertine Grates    RDMS

## 2019-05-08 ENCOUNTER — Encounter: Payer: Medicaid Other | Admitting: Certified Nurse Midwife

## 2019-05-09 ENCOUNTER — Other Ambulatory Visit: Payer: Self-pay

## 2019-05-09 ENCOUNTER — Ambulatory Visit (INDEPENDENT_AMBULATORY_CARE_PROVIDER_SITE_OTHER): Payer: Medicaid Other | Admitting: Certified Nurse Midwife

## 2019-05-09 ENCOUNTER — Encounter: Payer: Self-pay | Admitting: Certified Nurse Midwife

## 2019-05-09 VITALS — BP 128/70 | HR 104 | Wt 196.2 lb

## 2019-05-09 DIAGNOSIS — Z3482 Encounter for supervision of other normal pregnancy, second trimester: Secondary | ICD-10-CM

## 2019-05-09 DIAGNOSIS — Z3A25 25 weeks gestation of pregnancy: Secondary | ICD-10-CM

## 2019-05-09 LAB — POCT URINALYSIS DIPSTICK OB
Bilirubin, UA: NEGATIVE
Blood, UA: NEGATIVE
Glucose, UA: NEGATIVE
Ketones, UA: NEGATIVE
Leukocytes, UA: NEGATIVE
Nitrite, UA: NEGATIVE
POC,PROTEIN,UA: NEGATIVE
Spec Grav, UA: 1.015 (ref 1.010–1.025)
Urobilinogen, UA: 0.2 E.U./dL
pH, UA: 6.5 (ref 5.0–8.0)

## 2019-05-09 NOTE — Patient Instructions (Addendum)
Glucose Tolerance Test During Pregnancy Why am I having this test? The glucose tolerance test (GTT) is done to check how your body processes sugar (glucose). This is one of several tests used to diagnose diabetes that develops during pregnancy (gestational diabetes mellitus). Gestational diabetes is a temporary form of diabetes that some women develop during pregnancy. It usually occurs during the second trimester of pregnancy and goes away after delivery. Testing (screening) for gestational diabetes usually occurs between 24 and 28 weeks of pregnancy. You may have the GTT test after having a 1-hour glucose screening test if the results from that test indicate that you may have gestational diabetes. You may also have this test if:  You have a history of gestational diabetes.  You have a history of giving birth to very large babies or have experienced repeated fetal loss (stillbirth).  You have signs and symptoms of diabetes, such as: ? Changes in your vision. ? Tingling or numbness in your hands or feet. ? Changes in hunger, thirst, and urination that are not otherwise explained by your pregnancy. What is being tested? This test measures the amount of glucose in your blood at different times during a period of 3 hours. This indicates how well your body is able to process glucose. What kind of sample is taken?  Blood samples are required for this test. They are usually collected by inserting a needle into a blood vessel. How do I prepare for this test?  For 3 days before your test, eat normally. Have plenty of carbohydrate-rich foods.  Follow instructions from your health care provider about: ? Eating or drinking restrictions on the day of the test. You may be asked to not eat or drink anything other than water (fast) starting 8-10 hours before the test. ? Changing or stopping your regular medicines. Some medicines may interfere with this test. Tell a health care provider about:  All  medicines you are taking, including vitamins, herbs, eye drops, creams, and over-the-counter medicines.  Any blood disorders you have.  Any surgeries you have had.  Any medical conditions you have. What happens during the test? First, your blood glucose will be measured. This is referred to as your fasting blood glucose, since you fasted before the test. Then, you will drink a glucose solution that contains a certain amount of glucose. Your blood glucose will be measured again 1, 2, and 3 hours after drinking the solution. This test takes about 3 hours to complete. You will need to stay at the testing location during this time. During the testing period:  Do not eat or drink anything other than the glucose solution.  Do not exercise.  Do not use any products that contain nicotine or tobacco, such as cigarettes and e-cigarettes. If you need help stopping, ask your health care provider. The testing procedure may vary among health care providers and hospitals. How are the results reported? Your results will be reported as milligrams of glucose per deciliter of blood (mg/dL) or millimoles per liter (mmol/L). Your health care provider will compare your results to normal ranges that were established after testing a large group of people (reference ranges). Reference ranges may vary among labs and hospitals. For this test, common reference ranges are:  Fasting: less than 95-105 mg/dL (5.3-5.8 mmol/L).  1 hour after drinking glucose: less than 180-190 mg/dL (10.0-10.5 mmol/L).  2 hours after drinking glucose: less than 155-165 mg/dL (8.6-9.2 mmol/L).  3 hours after drinking glucose: 140-145 mg/dL (7.8-8.1 mmol/L). What do the   results mean? Results within reference ranges are considered normal, meaning that your glucose levels are well-controlled. If two or more of your blood glucose levels are high, you may be diagnosed with gestational diabetes. If only one level is high, your health care  provider may suggest repeat testing or other tests to confirm a diagnosis. Talk with your health care provider about what your results mean. Questions to ask your health care provider Ask your health care provider, or the department that is doing the test:  When will my results be ready?  How will I get my results?  What are my treatment options?  What other tests do I need?  What are my next steps? Summary  The glucose tolerance test (GTT) is one of several tests used to diagnose diabetes that develops during pregnancy (gestational diabetes mellitus). Gestational diabetes is a temporary form of diabetes that some women develop during pregnancy.  You may have the GTT test after having a 1-hour glucose screening test if the results from that test indicate that you may have gestational diabetes. You may also have this test if you have any symptoms or risk factors for gestational diabetes.  Talk with your health care provider about what your results mean. This information is not intended to replace advice given to you by your health care provider. Make sure you discuss any questions you have with your health care provider. Document Revised: 08/03/2018 Document Reviewed: 11/22/2016 Elsevier Patient Education  2020 [employer].  Preparing for Vaginal Birth After Cesarean Delivery Vaginal birth after cesarean delivery (VBAC) is giving birth vaginally after previously delivering a baby through a cesarean section (C-section). You and your health are provider will discuss your options and whether you may be a good candidate for VBAC. What are my options? After a cesarean delivery, your options for future deliveries may include:  Scheduled repeat cesarean delivery. This is done in a hospital with an operating room.  Trial of labor after cesarean (TOLAC). A successful TOLAC results in a vaginal delivery. If it is not successful, you will need to have a cesarean delivery. TOLAC should be  attempted in facilities where an emergency cesarean delivery can be performed. It should not be done as a home birth. Talk with your health care provider about the risks and benefits of each option early in your pregnancy. The best option for you will depend on your preferences and your overall health as well as your baby's. What should I know about my past cesarean delivery? It is important to know what type of incision was made in your uterus in a past cesarean delivery. The type of incision can affect the success of your TOLAC. Types of incisions include:  Low transverse. This is a side-to-side cut low on your uterus. The scar on your skin looks like a horizontal line just above your pubic area. This type of cut is the most common and makes you a good candidate for TOLAC.  Low vertical. This is an up-and-down cut low on your uterus. The scar on your skin looks like a vertical line between your pubic area and belly button. This type of cut puts you at higher risk for problems during TOLAC.  High vertical or classical. This is an up-and-down cut high on your uterus. The scar on your skin looks like a vertical line that runs over the top of your belly button. This type of cut has the highest risk for problems and usually means that TOLAC is not  an option. When is VBAC not an option? As you progress through your pregnancy, circumstances may change and you may need to reconsider your options. Your situation may also change even as you begin TOLAC. Your health care provider may not want you to attempt a VBAC if you:  Need to have labor started (induced) because your cervix is not ready for labor.  Have never had a vaginal delivery.  Have had more than two cesarean deliveries.  Are overdue.  Are pregnant with a very large baby.  Have a condition that causes high blood pressure (preeclampsia). Questions to ask your health care provider  Am I a good candidate for TOLAC?  What are my chances of  a successful vaginal delivery?  Is my preferred birth location equipped for a TOLAC?  What are my pain management options during a TOLAC? Where to find more information  American [employer] of Obstetricians and Gynecologists: www.acog.[place]: www.midwife.org Summary  Vaginal birth after cesarean delivery (VBAC) is giving birth vaginally after previously delivering a baby through a cesarean section (C-section).  VBAC may be a safe and appropriate option for you depending on your medical history and other risk factors. Talk with your health care provider about the options available to you, and the risks and benefits of each early in your pregnancy.  TOLAC should be attempted in facilities where emergency cesarean section procedures can be performed. This information is not intended to replace advice given to you by your health care provider. Make sure you discuss any questions you have with your health care provider. Document Revised: 08/08/2018 Document Reviewed: 07/22/2016 Elsevier Patient Education  [place].

## 2019-05-09 NOTE — Progress Notes (Signed)
ROB doing well. Feels good movement. Discussed glucose testing at next visit. Also discussed MD consult at 32 wk for TOLAC counseling. She verbalizes and agrees to plan of care. Follow up 3 wks.   [name], CNM

## 2019-05-14 ENCOUNTER — Encounter: Payer: Medicaid Other | Admitting: Certified Nurse Midwife

## 2019-05-30 ENCOUNTER — Other Ambulatory Visit: Payer: Self-pay

## 2019-05-30 ENCOUNTER — Other Ambulatory Visit: Payer: Medicaid Other

## 2019-05-30 ENCOUNTER — Ambulatory Visit (INDEPENDENT_AMBULATORY_CARE_PROVIDER_SITE_OTHER): Payer: Medicaid Other | Admitting: Certified Nurse Midwife

## 2019-05-30 VITALS — BP 121/68 | HR 98 | Wt 197.4 lb

## 2019-05-30 DIAGNOSIS — Z3482 Encounter for supervision of other normal pregnancy, second trimester: Secondary | ICD-10-CM | POA: Diagnosis not present

## 2019-05-30 LAB — POCT URINALYSIS DIPSTICK OB
Bilirubin, UA: NEGATIVE
Blood, UA: NEGATIVE
Glucose, UA: NEGATIVE
Ketones, UA: NEGATIVE
Leukocytes, UA: NEGATIVE
Nitrite, UA: NEGATIVE
POC,PROTEIN,UA: NEGATIVE
Spec Grav, UA: 1.01 (ref 1.010–1.025)
Urobilinogen, UA: 0.2 E.U./dL
pH, UA: 5 (ref 5.0–8.0)

## 2019-05-30 MED ORDER — TETANUS-DIPHTH-ACELL PERTUSSIS 5-2.5-18.5 LF-MCG/0.5 IM SUSP: 0.5000 mL | Freq: Once | INTRAMUSCULAR | Status: DC

## 2019-05-30 NOTE — Patient Instructions (Signed)
Glucose Tolerance Test During Pregnancy Why am I having this test? The glucose tolerance test (GTT) is done to check how your body processes sugar (glucose). This is one of several tests used to diagnose diabetes that develops during pregnancy (gestational diabetes mellitus). Gestational diabetes is a temporary form of diabetes that some women develop during pregnancy. It usually occurs during the second trimester of pregnancy and goes away after delivery. Testing (screening) for gestational diabetes usually occurs between 24 and 28 weeks of pregnancy. You may have the GTT test after having a 1-hour glucose screening test if the results from that test indicate that you may have gestational diabetes. You may also have this test if:  You have a history of gestational diabetes.  You have a history of giving birth to very large babies or have experienced repeated fetal loss (stillbirth).  You have signs and symptoms of diabetes, such as: ? Changes in your vision. ? Tingling or numbness in your hands or feet. ? Changes in hunger, thirst, and urination that are not otherwise explained by your pregnancy. What is being tested? This test measures the amount of glucose in your blood at different times during a period of 3 hours. This indicates how well your body is able to process glucose. What kind of sample is taken?  Blood samples are required for this test. They are usually collected by inserting a needle into a blood vessel. How do I prepare for this test?  For 3 days before your test, eat normally. Have plenty of carbohydrate-rich foods.  Follow instructions from your health care provider about: ? Eating or drinking restrictions on the day of the test. You may be asked to not eat or drink anything other than water (fast) starting 8-10 hours before the test. ? Changing or stopping your regular medicines. Some medicines may interfere with this test. Tell a health care provider about:  All  medicines you are taking, including vitamins, herbs, eye drops, creams, and over-the-counter medicines.  Any blood disorders you have.  Any surgeries you have had.  Any medical conditions you have. What happens during the test? First, your blood glucose will be measured. This is referred to as your fasting blood glucose, since you fasted before the test. Then, you will drink a glucose solution that contains a certain amount of glucose. Your blood glucose will be measured again 1, 2, and 3 hours after drinking the solution. This test takes about 3 hours to complete. You will need to stay at the testing location during this time. During the testing period:  Do not eat or drink anything other than the glucose solution.  Do not exercise.  Do not use any products that contain nicotine or tobacco, such as cigarettes and e-cigarettes. If you need help stopping, ask your health care provider. The testing procedure may vary among health care providers and hospitals. How are the results reported? Your results will be reported as milligrams of glucose per deciliter of blood (mg/dL) or millimoles per liter (mmol/L). Your health care provider will compare your results to normal ranges that were established after testing a large group of people (reference ranges). Reference ranges may vary among labs and hospitals. For this test, common reference ranges are:  Fasting: less than 95-105 mg/dL (5.3-5.8 mmol/L).  1 hour after drinking glucose: less than 180-190 mg/dL (10.0-10.5 mmol/L).  2 hours after drinking glucose: less than 155-165 mg/dL (8.6-9.2 mmol/L).  3 hours after drinking glucose: 140-145 mg/dL (7.8-8.1 mmol/L). What do the   results mean? Results within reference ranges are considered normal, meaning that your glucose levels are well-controlled. If two or more of your blood glucose levels are high, you may be diagnosed with gestational diabetes. If only one level is high, your health care  provider may suggest repeat testing or other tests to confirm a diagnosis. Talk with your health care provider about what your results mean. Questions to ask your health care provider Ask your health care provider, or the department that is doing the test:  When will my results be ready?  How will I get my results?  What are my treatment options?  What other tests do I need?  What are my next steps? Summary  The glucose tolerance test (GTT) is one of several tests used to diagnose diabetes that develops during pregnancy (gestational diabetes mellitus). Gestational diabetes is a temporary form of diabetes that some women develop during pregnancy.  You may have the GTT test after having a 1-hour glucose screening test if the results from that test indicate that you may have gestational diabetes. You may also have this test if you have any symptoms or risk factors for gestational diabetes.  Talk with your health care provider about what your results mean. This information is not intended to replace advice given to you by your health care provider. Make sure you discuss any questions you have with your health care provider. Document Revised: 08/03/2018 Document Reviewed: 11/22/2016 Elsevier Patient Education  2020 Elsevier Inc.  

## 2019-05-30 NOTE — Progress Notes (Signed)
ROB doing well. 28 wk labs today. Glucose screen/RPR/CBC/BTC/Tdap today. Discussed BC after baby, information given, Sample birth plan given. Will discuss at next appointment. She will see Marcelino Duster, then me , then have appointment with MD for VBAC counseling. She verbalizes and agrees to plan. Follow up 2 wk.   [name], CNM

## 2019-05-30 NOTE — Lactation Note (Signed)
Lactation Consultation Note  Patient Name: Lindsay Mcdowell [id number] Date: 05/30/2019     Maternal Data    Feeding    LATCH Score                   Interventions    Lactation Tools Discussed/Used     Consult Status   G2P1. Mom breasted her first child  for almost 2 years. Plan to breastfeed with current pregnancy. Lactation student reinforced the benefits of breastfeeding per the Ready, Set, Baby curriculum. Lindsay Mcdowell encouraged to review breastfeeding information on Ready, set, Baby web site and given information for virtual breastfeeding classes. No questions at this time.     Lindsay Mcdowell 05/30/2019, 9:39 AM

## 2019-05-31 ENCOUNTER — Other Ambulatory Visit: Payer: Self-pay | Admitting: Certified Nurse Midwife

## 2019-05-31 DIAGNOSIS — R7309 Other abnormal glucose: Secondary | ICD-10-CM

## 2019-05-31 DIAGNOSIS — Z3483 Encounter for supervision of other normal pregnancy, third trimester: Secondary | ICD-10-CM

## 2019-05-31 LAB — CBC
Hematocrit: 34.4 % (ref 34.0–46.6)
Hemoglobin: 11.2 g/dL (ref 11.1–15.9)
MCH: 29.3 pg (ref 26.6–33.0)
MCHC: 32.6 g/dL (ref 31.5–35.7)
MCV: 90 fL (ref 79–97)
Platelets: 311 10*3/uL (ref 150–450)
RBC: 3.82 x10E6/uL (ref 3.77–5.28)
RDW: 11.8 % (ref 11.7–15.4)
WBC: 8.8 10*3/uL (ref 3.4–10.8)

## 2019-05-31 LAB — GLUCOSE, 1 HOUR GESTATIONAL: Gestational Diabetes Screen: 144 mg/dL — ABNORMAL HIGH (ref 65–139)

## 2019-05-31 LAB — RPR: RPR Ser Ql: NONREACTIVE

## 2019-06-06 ENCOUNTER — Other Ambulatory Visit: Payer: Medicaid Other

## 2019-06-13 ENCOUNTER — Other Ambulatory Visit: Payer: Medicaid Other

## 2019-06-13 ENCOUNTER — Other Ambulatory Visit: Payer: Self-pay

## 2019-06-13 DIAGNOSIS — Z3483 Encounter for supervision of other normal pregnancy, third trimester: Secondary | ICD-10-CM

## 2019-06-13 DIAGNOSIS — R7309 Other abnormal glucose: Secondary | ICD-10-CM | POA: Diagnosis not present

## 2019-06-14 LAB — GESTATIONAL GLUCOSE TOLERANCE
Glucose, Fasting: 88 mg/dL (ref 65–94)
Glucose, GTT - 1 Hour: 159 mg/dL (ref 65–179)
Glucose, GTT - 2 Hour: 141 mg/dL (ref 65–154)
Glucose, GTT - 3 Hour: 134 mg/dL (ref 65–139)

## 2019-06-18 ENCOUNTER — Other Ambulatory Visit: Payer: Self-pay

## 2019-06-18 ENCOUNTER — Ambulatory Visit (INDEPENDENT_AMBULATORY_CARE_PROVIDER_SITE_OTHER): Payer: Medicaid Other | Admitting: Certified Nurse Midwife

## 2019-06-18 VITALS — BP 110/75 | HR 93 | Wt 199.2 lb

## 2019-06-18 DIAGNOSIS — O34219 Maternal care for unspecified type scar from previous cesarean delivery: Secondary | ICD-10-CM

## 2019-06-18 DIAGNOSIS — Z3493 Encounter for supervision of normal pregnancy, unspecified, third trimester: Secondary | ICD-10-CM

## 2019-06-18 DIAGNOSIS — Z3483 Encounter for supervision of other normal pregnancy, third trimester: Secondary | ICD-10-CM

## 2019-06-18 DIAGNOSIS — Z3A30 30 weeks gestation of pregnancy: Secondary | ICD-10-CM

## 2019-06-18 LAB — POCT URINALYSIS DIPSTICK OB
Bilirubin, UA: NEGATIVE
Blood, UA: NEGATIVE
Glucose, UA: NEGATIVE
Ketones, UA: NEGATIVE
Leukocytes, UA: NEGATIVE
Nitrite, UA: NEGATIVE
POC,PROTEIN,UA: NEGATIVE
Spec Grav, UA: 1.015 (ref 1.010–1.025)
Urobilinogen, UA: 0.2 E.U./dL
pH, UA: 7 (ref 5.0–8.0)

## 2019-06-18 NOTE — Progress Notes (Addendum)
ROB-Doing well. [facility] Production manager provided. Discussed risks vs benefits of TOLAC vs repeat C-section.  Risk of uterine rupture at term is ~ 1%.  Risk of failed trial of labor after cesarean (TOLAC) without a vaginal birth after cesarean (VBAC) resulting in repeat cesarean delivery (RCD) in about 20 to 40 percent of women who attempt VBAC.  Risk of requiring emergency C-section due to uterine rupture discussed. The benefits of a trial of labor after cesarean (TOLAC) resulting in a vaginal birth after cesarean (VBAC) include the following: shorter length of hospital stay and postpartum recovery (in most cases); fewer complications, such as postpartum fever, wound or uterine infection, thromboembolism (blood clots in the leg or lung), need for blood transfusion and fewer neonatal breathing problems.  Patient notes understanding. VBAC Calculator score: 64.2%. Still desires TOLAC.  Patient will schedule next appointment with MD for official consult. Anticipatory guidance regarding course of prenatal care. Reviewed red flag symptoms and when to call. RTC x 2 weeks for VBAC consultation with MD; RTC x 4 weeks for ROB or sooner if needed.

## 2019-06-18 NOTE — Progress Notes (Signed)
Patient declined to speak with lactation at this time.  Patient plans on breastfeeding.

## 2019-06-18 NOTE — Progress Notes (Signed)
ROB-No complaints.  

## 2019-06-18 NOTE — Patient Instructions (Addendum)
Fetal Movement Counts Patient Name: ________________________________________________ Patient Due Date: ____________________ What is a fetal movement count?  A fetal movement count is the number of times that you feel your baby move during a certain amount of time. This may also be called a fetal kick count. A fetal movement count is recommended for every pregnant woman. You may be asked to start counting fetal movements as early as week 28 of your pregnancy. Pay attention to when your baby is most active. You may notice your baby's sleep and wake cycles. You may also notice things that make your baby move more. You should do a fetal movement count:  When your baby is normally most active.  At the same time each day. A good time to count movements is while you are resting, after having something to eat and drink. How do I count fetal movements? 1. Find a quiet, comfortable area. Sit, or lie down on your side. 2. Write down the date, the start time and stop time, and the number of movements that you felt between those two times. Take this information with you to your health care visits. 3. Write down your start time when you feel the first movement. 4. Count kicks, flutters, swishes, rolls, and jabs. You should feel at least 10 movements. 5. You may stop counting after you have felt 10 movements, or if you have been counting for 2 hours. Write down the stop time. 6. If you do not feel 10 movements in 2 hours, contact your health care provider for further instructions. Your health care provider may want to do additional tests to assess your baby's well-being. Contact a health care provider if:  You feel fewer than 10 movements in 2 hours.  Your baby is not moving like he or she usually does. Date: ____________ Start time: ____________ Stop time: ____________ Movements: ____________ Date: ____________ Start time: ____________ Stop time: ____________ Movements: ____________ Date: ____________  Start time: ____________ Stop time: ____________ Movements: ____________ Date: ____________ Start time: ____________ Stop time: ____________ Movements: ____________ Date: ____________ Start time: ____________ Stop time: ____________ Movements: ____________ Date: ____________ Start time: ____________ Stop time: ____________ Movements: ____________ Date: ____________ Start time: ____________ Stop time: ____________ Movements: ____________ Date: ____________ Start time: ____________ Stop time: ____________ Movements: ____________ Date: ____________ Start time: ____________ Stop time: ____________ Movements: ____________ This information is not intended to replace advice given to you by your health care provider. Make sure you discuss any questions you have with your health care provider. Document Revised: 11/30/2018 Document Reviewed: 11/30/2018 Elsevier Patient Education  2020 [employer].  Vaginal Birth After Cesarean Delivery  Vaginal birth after cesarean delivery (VBAC) is giving birth vaginally after previously delivering a baby through a cesarean section (C-section). A VBAC may be a safe option for you, depending on your health and other factors. It is important to discuss VBAC with your health care provider early in your pregnancy so you can understand the risks, benefits, and options. Having these discussions early will give you time to make your birth plan. Who are the best candidates for VBAC? The best candidates for VBAC are women who:  Have had one or two prior cesarean deliveries, and the incision made during the delivery was horizontal (low transverse).  Do not have a vertical (classical) scar on their uterus.  Have not had a tear in the wall of their uterus (uterine rupture).  Plan to have more pregnancies. A VBAC is also more likely to be successful:  In women  who have previously given birth vaginally.  When labor starts by itself (spontaneously) before the due date. What  are the benefits of VBAC? The benefits of delivering your baby vaginally instead of by a cesarean delivery include:  A shorter hospital stay.  A faster recovery time.  Less pain.  Avoiding risks associated with major surgery, such as infection and blood clots.  Less blood loss and less need for donated blood (transfusions). What are the risks of VBAC? The main risk of attempting a VBAC is that it may fail, forcing your health care provider to deliver your baby by a C-section. Other risks are rare and include:  Tearing (rupture) of the scar from a past cesarean delivery.  Other risks associated with vaginal deliveries. If a repeat cesarean delivery is needed, the risks include:  Blood loss.  Infection.  Blood clot.  Damage to surrounding organs.  Removal of the uterus (hysterectomy), if it is damaged.  Placenta problems in future pregnancies. What else should I know about my options? Delivering a baby through a VBAC is similar to having a normal spontaneous vaginal delivery. Therefore, it is safe:  To try with twins.  For your health care provider to try to turn the baby from a breech position (external cephalic version) during labor.  With epidural analgesia for pain relief. Consider where you would like to deliver your baby. VBAC should be attempted in facilities where an emergency cesarean delivery can be performed. VBAC is not recommended for home births. Any changes in your health or your baby's health during your pregnancy may make it necessary to change your initial decision about VBAC. Your health care provider may recommend that you do not attempt a VBAC if:  Your baby's suspected weight is 8.8 lb (4 kg) or more.  You have preeclampsia. This is a condition that causes high blood pressure along with other symptoms, such as swelling and headaches.  You will have VBAC less than 19 months after your cesarean delivery.  You are past your due date.  You need to  have labor started (induced) because your cervix is not ready for labor (unfavorable). Where to find more information  American Pregnancy Association: americanpregnancy.org  [employer] of Obstetricians and Gynecologists: acog.org Summary  Vaginal birth after cesarean delivery (VBAC) is giving birth vaginally after previously delivering a baby through a cesarean section (C-section). A VBAC may be a safe option for you, depending on your health and other factors.  Discuss VBAC with your health care provider early in your pregnancy so you can understand the risks, benefits, options, and have plenty of time to make your birth plan.  The main risk of attempting a VBAC is that it may fail, forcing your health care provider to deliver your baby by a C-section. Other risks are rare. This information is not intended to replace advice given to you by your health care provider. Make sure you discuss any questions you have with your health care provider. Document Revised: 08/08/2018 Document Reviewed: 07/20/2016 Elsevier Patient Education  [place].  Preparing for Vaginal Birth After Cesarean Delivery Vaginal birth after cesarean delivery (VBAC) is giving birth vaginally after previously delivering a baby through a cesarean section (C-section). You and your health are provider will discuss your options and whether you may be a good candidate for VBAC. What are my options? After a cesarean delivery, your options for future deliveries may include:  Scheduled repeat cesarean delivery. This is done in a hospital with an  with an operating room.  Trial of labor after cesarean (TOLAC). A successful TOLAC results in a vaginal delivery. If it is not successful, you will need to have a cesarean delivery. TOLAC should be attempted in facilities where an emergency cesarean delivery can be performed. It should not be done as a home birth. Talk with your health care provider about the  risks and benefits of each option early in your pregnancy. The best option for you will depend on your preferences and your overall health as well as your baby's. What should I know about my past cesarean delivery? It is important to know what type of incision was made in your uterus in a past cesarean delivery. The type of incision can affect the success of your TOLAC. Types of incisions include:  Low transverse. This is a side-to-side cut low on your uterus. The scar on your skin looks like a horizontal line just above your pubic area. This type of cut is the most common and makes you a good candidate for TOLAC.  Low vertical. This is an up-and-down cut low on your uterus. The scar on your skin looks like a vertical line between your pubic area and belly button. This type of cut puts you at higher risk for problems during TOLAC.  High vertical or classical. This is an up-and-down cut high on your uterus. The scar on your skin looks like a vertical line that runs over the top of your belly button. This type of cut has the highest risk for problems and usually means that TOLAC is not an option. When is VBAC not an option? As you progress through your pregnancy, circumstances may change and you may need to reconsider your options. Your situation may also change even as you begin TOLAC. Your health care provider may not want you to attempt a VBAC if you:  Need to have labor started (induced) because your cervix is not ready for labor.  Have never had a vaginal delivery.  Have had more than two cesarean deliveries.  Are overdue.  Are pregnant with a very large baby.  Have a condition that causes high blood pressure (preeclampsia). Questions to ask your health care provider  Am I a good candidate for TOLAC?  What are my chances of a successful vaginal delivery?  Is my preferred birth location equipped for a TOLAC?  What are my pain management options during a TOLAC? Where to find more  information  American Congress of Obstetricians and Gynecologists: www.acog.org  American College of Nurse-Midwives: www.midwife.org Summary  Vaginal birth after cesarean delivery (VBAC) is giving birth vaginally after previously delivering a baby through a cesarean section (C-section).  VBAC may be a safe and appropriate option for you depending on your medical history and other risk factors. Talk with your health care provider about the options available to you, and the risks and benefits of each early in your pregnancy.  TOLAC should be attempted in facilities where emergency cesarean section procedures can be performed. This information is not intended to replace advice given to you by your health care provider. Make sure you discuss any questions you have with your health care provider. Document Revised: 08/08/2018 Document Reviewed: 07/22/2016 Elsevier Patient Education  2020 Elsevier Inc.  

## 2019-06-19 DIAGNOSIS — O34219 Maternal care for unspecified type scar from previous cesarean delivery: Secondary | ICD-10-CM | POA: Insufficient documentation

## 2019-07-04 ENCOUNTER — Ambulatory Visit (INDEPENDENT_AMBULATORY_CARE_PROVIDER_SITE_OTHER): Payer: Medicaid Other | Admitting: Obstetrics and Gynecology

## 2019-07-04 ENCOUNTER — Encounter: Payer: Self-pay | Admitting: Obstetrics and Gynecology

## 2019-07-04 ENCOUNTER — Other Ambulatory Visit: Payer: Self-pay

## 2019-07-04 VITALS — BP 114/71 | HR 85 | Ht 64.0 in | Wt 203.7 lb

## 2019-07-04 DIAGNOSIS — Z3A33 33 weeks gestation of pregnancy: Secondary | ICD-10-CM | POA: Diagnosis not present

## 2019-07-04 LAB — POCT URINALYSIS DIPSTICK OB
Bilirubin, UA: NEGATIVE
Blood, UA: NEGATIVE
Glucose, UA: NEGATIVE
Ketones, UA: NEGATIVE
Leukocytes, UA: NEGATIVE
Nitrite, UA: NEGATIVE
POC,PROTEIN,UA: NEGATIVE
Spec Grav, UA: 1.01 (ref 1.010–1.025)
Urobilinogen, UA: 0.2 E.U./dL
pH, UA: 7.5 (ref 5.0–8.0)

## 2019-07-04 NOTE — Progress Notes (Signed)
ROB-Pt present for consult. Pt stated that she was doing well no problems.

## 2019-07-04 NOTE — Patient Instructions (Signed)
Third Trimester of Pregnancy The third trimester is from week 28 through week 40 (months 7 through 9). The third trimester is a time when the unborn baby (fetus) is growing rapidly. At the end of the ninth month, the fetus is about 20 inches in length and weighs 6-10 pounds. Body changes during your third trimester Your body will continue to go through many changes during pregnancy. The changes vary from woman to woman. During the third trimester:  Your weight will continue to increase. You can expect to gain 25-35 pounds (11-16 kg) by the end of the pregnancy.  You may begin to get stretch marks on your hips, abdomen, and breasts.  You may urinate more often because the fetus is moving lower into your pelvis and pressing on your bladder.  You may develop or continue to have heartburn. This is caused by increased hormones that slow down muscles in the digestive tract.  You may develop or continue to have constipation because increased hormones slow digestion and cause the muscles that push waste through your intestines to relax.  You may develop hemorrhoids. These are swollen veins (varicose veins) in the rectum that can itch or be painful.  You may develop swollen, bulging veins (varicose veins) in your legs.  You may have increased body aches in the pelvis, back, or thighs. This is due to weight gain and increased hormones that are relaxing your joints.  You may have changes in your hair. These can include thickening of your hair, rapid growth, and changes in texture. Some women also have hair loss during or after pregnancy, or hair that feels dry or thin. Your hair will most likely return to normal after your baby is born.  Your breasts will continue to grow and they will continue to become tender. A yellow fluid (colostrum) may leak from your breasts. This is the first milk you are producing for your baby.  Your belly button may stick out.  You may notice more swelling in your hands,  face, or ankles.  You may have increased tingling or numbness in your hands, arms, and legs. The skin on your belly may also feel numb.  You may feel short of breath because of your expanding uterus.  You may have more problems sleeping. This can be caused by the size of your belly, increased need to urinate, and an increase in your body's metabolism.  You may notice the fetus "dropping," or moving lower in your abdomen (lightening).  You may have increased vaginal discharge.  You may notice your joints feel loose and you may have pain around your pelvic bone. What to expect at prenatal visits You will have prenatal exams every 2 weeks until week 36. Then you will have weekly prenatal exams. During a routine prenatal visit:  You will be weighed to make sure you and the baby are growing normally.  Your blood pressure will be taken.  Your abdomen will be measured to track your baby's growth.  The fetal heartbeat will be listened to.  Any test results from the previous visit will be discussed.  You may have a cervical check near your due date to see if your cervix has softened or thinned (effaced).  You will be tested for Group B streptococcus. This happens between 35 and 37 weeks. Your health care provider may ask you:  What your birth plan is.  How you are feeling.  If you are feeling the baby move.  If you have had any abnormal   symptoms, such as leaking fluid, bleeding, severe headaches, or abdominal cramping.  If you are using any tobacco products, including cigarettes, chewing tobacco, and electronic cigarettes.  If you have any questions. Other tests or screenings that may be performed during your third trimester include:  Blood tests that check for low iron levels (anemia).  Fetal testing to check the health, activity level, and growth of the fetus. Testing is done if you have certain medical conditions or if there are problems during the pregnancy.  Nonstress test  (NST). This test checks the health of your baby to make sure there are no signs of problems, such as the baby not getting enough oxygen. During this test, a belt is placed around your belly. The baby is made to move, and its heart rate is monitored during movement. What is false labor? False labor is a condition in which you feel small, irregular tightenings of the muscles in the womb (contractions) that usually go away with rest, changing position, or drinking water. These are called Braxton Hicks contractions. Contractions may last for hours, days, or even weeks before true labor sets in. If contractions come at regular intervals, become more frequent, increase in intensity, or become painful, you should see your health care provider. What are the signs of labor?  Abdominal cramps.  Regular contractions that start at 10 minutes apart and become stronger and more frequent with time.  Contractions that start on the top of the uterus and spread down to the lower abdomen and back.  Increased pelvic pressure and dull back pain.  A watery or bloody mucus discharge that comes from the vagina.  Leaking of amniotic fluid. This is also known as your "water breaking." It could be a slow trickle or a gush. Let your health care provider know if it has a color or strange odor. If you have any of these signs, call your health care provider right away, even if it is before your due date. Follow these instructions at home: Medicines  Follow your health care provider's instructions regarding medicine use. Specific medicines may be either safe or unsafe to take during pregnancy.  Take a prenatal vitamin that contains at least 600 micrograms (mcg) of folic acid.  If you develop constipation, try taking a stool softener if your health care provider approves. Eating and drinking   Eat a balanced diet that includes fresh fruits and vegetables, whole grains, good sources of protein such as meat, eggs, or tofu,  and low-fat dairy. Your health care provider will help you determine the amount of weight gain that is right for you.  Avoid raw meat and uncooked cheese. These carry germs that can cause birth defects in the baby.  If you have low calcium intake from food, talk to your health care provider about whether you should take a daily calcium supplement.  Eat four or five small meals rather than three large meals a day.  Limit foods that are high in fat and processed sugars, such as fried and sweet foods.  To prevent constipation: ? Drink enough fluid to keep your urine clear or pale yellow. ? Eat foods that are high in fiber, such as fresh fruits and vegetables, whole grains, and beans. Activity  Exercise only as directed by your health care provider. Most women can continue their usual exercise routine during pregnancy. Try to exercise for 30 minutes at least 5 days a week. Stop exercising if you experience uterine contractions.  Avoid heavy lifting.  Do   not exercise in extreme heat or humidity, or at high altitudes.  Wear low-heel, comfortable shoes.  Practice good posture.  You may continue to have sex unless your health care provider tells you otherwise. Relieving pain and discomfort  Take frequent breaks and rest with your legs elevated if you have leg cramps or low back pain.  Take warm sitz baths to soothe any pain or discomfort caused by hemorrhoids. Use hemorrhoid cream if your health care provider approves.  Wear a good support bra to prevent discomfort from breast tenderness.  If you develop varicose veins: ? Wear support pantyhose or compression stockings as told by your healthcare provider. ? Elevate your feet for 15 minutes, 3-4 times a day. Prenatal care  Write down your questions. Take them to your prenatal visits.  Keep all your prenatal visits as told by your health care provider. This is important. Safety  Wear your seat belt at all times when driving.  Make  a list of emergency phone numbers, including numbers for family, friends, the hospital, and police and fire departments. General instructions  Avoid cat litter boxes and soil used by cats. These carry germs that can cause birth defects in the baby. If you have a cat, ask someone to clean the litter box for you.  Do not travel far distances unless it is absolutely necessary and only with the approval of your health care provider.  Do not use hot tubs, steam rooms, or saunas.  Do not drink alcohol.  Do not use any products that contain nicotine or tobacco, such as cigarettes and e-cigarettes. If you need help quitting, ask your health care provider.  Do not use any medicinal herbs or unprescribed drugs. These chemicals affect the formation and growth of the baby.  Do not douche or use tampons or scented sanitary pads.  Do not cross your legs for long periods of time.  To prepare for the arrival of your baby: ? Take prenatal classes to understand, practice, and ask questions about labor and delivery. ? Make a trial run to the hospital. ? Visit the hospital and tour the maternity area. ? Arrange for maternity or paternity leave through employers. ? Arrange for family and friends to take care of pets while you are in the hospital. ? Purchase a rear-facing car seat and make sure you know how to install it in your car. ? Pack your hospital bag. ? Prepare the baby's nursery. Make sure to remove all pillows and stuffed animals from the baby's crib to prevent suffocation.  Visit your dentist if you have not gone during your pregnancy. Use a soft toothbrush to brush your teeth and be gentle when you floss. Contact a health care provider if:  You are unsure if you are in labor or if your water has broken.  You become dizzy.  You have mild pelvic cramps, pelvic pressure, or nagging pain in your abdominal area.  You have lower back pain.  You have persistent nausea, vomiting, or  diarrhea.  You have an unusual or bad smelling vaginal discharge.  You have pain when you urinate. Get help right away if:  Your water breaks before 37 weeks.  You have regular contractions less than 5 minutes apart before 37 weeks.  You have a fever.  You are leaking fluid from your vagina.  You have spotting or bleeding from your vagina.  You have severe abdominal pain or cramping.  You have rapid weight loss or weight gain.  You have   shortness of breath with chest pain.  You notice sudden or extreme swelling of your face, hands, ankles, feet, or legs.  Your baby makes fewer than 10 movements in 2 hours.  You have severe headaches that do not go away when you take medicine.  You have vision changes. Summary  The third trimester is from week 28 through week 40, months 7 through 9. The third trimester is a time when the unborn baby (fetus) is growing rapidly.  During the third trimester, your discomfort may increase as you and your baby continue to gain weight. You may have abdominal, leg, and back pain, sleeping problems, and an increased need to urinate.  During the third trimester your breasts will keep growing and they will continue to become tender. A yellow fluid (colostrum) may leak from your breasts. This is the first milk you are producing for your baby.  False labor is a condition in which you feel small, irregular tightenings of the muscles in the womb (contractions) that eventually go away. These are called Braxton Hicks contractions. Contractions may last for hours, days, or even weeks before true labor sets in.  Signs of labor can include: abdominal cramps; regular contractions that start at 10 minutes apart and become stronger and more frequent with time; watery or bloody mucus discharge that comes from the vagina; increased pelvic pressure and dull back pain; and leaking of amniotic fluid. This information is not intended to replace advice given to you by your  health care provider. Make sure you discuss any questions you have with your health care provider. Document Revised: 08/03/2018 Document Reviewed: 05/18/2016 Elsevier Patient Education  2020 [employer]  

## 2019-07-04 NOTE — Progress Notes (Signed)
Consult ROB: Patient is a 29 y.o. G56P1001 female at [redacted]w[redacted]d who presents today for [facility] consultation versus elective repeat cesarean delivery (ERCD).  She had her previous C-section for failure to descend, chorioamnionitis with non-reassuring fetal tracing, and attempted forceps delivery (pushed for ~ 1 hr prior to use of forceps).  Infant was 8 lb 10 oz.   The following risks were discussed with the patient: Risk of uterine rupture at term is 0.78 percent with TOLAC and 0.22 percent with ERCD. 1 in 10 uterine ruptures will result in neonatal death or neurological injury. The benefits of a trial of labor after cesarean (TOLAC) resulting in a vaginal birth after cesarean (VBAC) include the following: shorter length of hospital stay and postpartum recovery (in most cases); fewer complications, such as postpartum fever, wound or uterine infection, thromboembolism (blood clots in the leg or lung), need for blood transfusion and fewer neonatal breathing problems. The risks of an attempted VBAC or TOLAC include the following: . Risk of failed trial of labor after cesarean (TOLAC) without a vaginal birth after cesarean (VBAC) resulting in repeat cesarean delivery (RCD) in about 20 to 40 percent of women who attempt VBAC.  Marland Kitchen Risk of rupture of uterus resulting in an emergency cesarean delivery. The risk of uterine rupture may be related in part to the type of uterine incision made during the first cesarean delivery. A previous transverse uterine incision has the lowest risk of rupture (0.2 to 1.5 percent risk). Vertical or T-shaped uterine incisions have a higher risk of uterine rupture (4 to 9 percent risk)The risk of fetal death is very low with both VBAC and elective repeat cesarean delivery (ERCD), but the likelihood of fetal death is higher with VBAC than with ERCD. Maternal death is very rare with either type of delivery. The risks of an elective repeat cesarean delivery (ERCD) were reviewed with the patient  including but not limited to: 05/998 risk of uterine rupture which could have serious consequences, bleeding which may require transfusion; infection which may require antibiotics; injury to bowel, bladder or other surrounding organs (bowel, bladder, ureters); injury to the fetus; need for additional procedures including hysterectomy in the event of a life-threatening hemorrhage; thromboembolic phenomenon; abnormal placentation; incisional problems; death and other postoperative or anesthesia complications.    VBAC calculated score is 62.5%. All her questions were answered and she continues to note preference for TOLAC/ERCD. Plans to have a doula for support. Has gone to [facility]. Will continue routine postpartum care.   Hildred Laser, MD [facility]

## 2019-07-16 ENCOUNTER — Other Ambulatory Visit: Payer: Self-pay

## 2019-07-16 ENCOUNTER — Encounter: Payer: Self-pay | Admitting: Certified Nurse Midwife

## 2019-07-16 ENCOUNTER — Ambulatory Visit (INDEPENDENT_AMBULATORY_CARE_PROVIDER_SITE_OTHER): Payer: Medicaid Other | Admitting: Certified Nurse Midwife

## 2019-07-16 VITALS — BP 111/64 | HR 94 | Wt 206.0 lb

## 2019-07-16 DIAGNOSIS — Z3493 Encounter for supervision of normal pregnancy, unspecified, third trimester: Secondary | ICD-10-CM

## 2019-07-16 NOTE — Progress Notes (Signed)
ROB doing well. Feels good movement. Discussed GBS testing and SVE next visit. Herbal prep handout given with instructions to start @ 37 wks. She verbalizes and agrees to plan. Follow up 2 wks.   [name], CNM

## 2019-07-16 NOTE — Patient Instructions (Signed)
Group B Streptococcus Infection During Pregnancy °Group B Streptococcus (GBS) is a type of bacteria that is often found in healthy people. It is commonly found in the rectum, vagina, and intestines. In people who are healthy and not pregnant, the bacteria rarely cause serious illness or complications. However, women who test positive for GBS during pregnancy can pass the bacteria to the baby during childbirth. This can cause serious infection in the baby after birth. °Women with GBS may also have infections during their pregnancy or soon after childbirth. The infections include urinary tract infections (UTIs) or infections of the uterus. GBS also increases a woman's risk of complications during pregnancy, such as early labor or delivery, miscarriage, or stillbirth. Routine testing for GBS is recommended for all pregnant women. °What are the causes? °This condition is caused by bacteria called Streptococcus agalactiae. °What increases the risk? °You may have a higher risk for GBS infection during pregnancy if you had one during a past pregnancy. °What are the signs or symptoms? °In most cases, GBS infection does not cause symptoms in pregnant women. If symptoms exist, they may include: °· Labor that starts before the 37th week of pregnancy. °· A UTI or bladder infection. This may cause a fever, frequent urination, or pain and burning during urination. °· Fever during labor. There can also be a rapid heartbeat in the mother or baby. °Rare but serious symptoms of a GBS infection in women include: °· Blood infection (septicemia). This may cause fever, chills, or confusion. °· Lung infection (pneumonia). This may cause fever, chills, cough, rapid breathing, chest pain, or difficulty breathing. °· Bone, joint, skin, or soft tissue infection. °How is this diagnosed? °You may be screened for GBS between week 35 and week 37 of pregnancy. If you have symptoms of preterm labor, you may be screened earlier. This condition is  diagnosed based on lab test results from: °· A swab of fluid from the vagina and rectum. °· A urine sample. °How is this treated? °This condition is treated with antibiotic medicine. Antibiotic medicine may be given: °· To you when you go into labor, or as soon as your water breaks. The medicines will continue until after you give birth. If you are having a cesarean delivery, you do not need antibiotics unless your water has broken. °· To your baby, if he or she requires treatment. Your health care provider will check your baby to decide if he or she needs antibiotics to prevent a serious infection. °Follow these instructions at home: °· Take over-the-counter and prescription medicines only as told by your health care provider. °· Take your antibiotic medicine as told by your health care provider. Do not stop taking the antibiotic even if you start to feel better. °· Keep all pre-birth (prenatal) visits and follow-up visits as told by your health care provider. This is important. °Contact a health care provider if: °· You have pain or burning when you urinate. °· You have to urinate more often than usual. °· You have a fever or chills. °· You develop a bad-smelling vaginal discharge. °Get help right away if: °· Your water breaks. °· You go into labor. °· You have severe pain in your abdomen. °· You have difficulty breathing. °· You have chest pain. °These symptoms may represent a serious problem that is an emergency. Do not wait to see if the symptoms will go away. Get medical help right away. Call your local emergency services (911 in the [place] Do not drive yourself to   the hospital. °Summary °· GBS is a type of bacteria that is common in healthy people. °· During pregnancy, colonization with GBS can cause serious complications for you or your baby. °· Your health care provider will screen you between 35 and 37 weeks of pregnancy to determine if you are colonized with GBS. °· If you are colonized with GBS during  pregnancy, your health care provider will recommend antibiotics through an IV during labor. °· After delivery, your baby will be evaluated for complications related to potential GBS infection and may require antibiotics to prevent a serious infection. °This information is not intended to replace advice given to you by your health care provider. Make sure you discuss any questions you have with your health care provider. °Document Revised: 11/06/2018 Document Reviewed: 11/06/2018 °Elsevier Patient Education © 2020 [employer] Inc. ° °

## 2019-07-26 ENCOUNTER — Other Ambulatory Visit: Payer: Self-pay

## 2019-07-26 ENCOUNTER — Encounter: Payer: Self-pay | Admitting: Obstetrics and Gynecology

## 2019-07-26 ENCOUNTER — Observation Stay
Admission: EM | Admit: 2019-07-26 | Discharge: 2019-07-27 | Disposition: A | Discharge status: Home / Self Care | Payer: Medicaid Other | Attending: Certified Nurse Midwife | Admitting: Certified Nurse Midwife

## 2019-07-26 DIAGNOSIS — Z3A36 36 weeks gestation of pregnancy: Secondary | ICD-10-CM | POA: Insufficient documentation

## 2019-07-26 DIAGNOSIS — O34219 Maternal care for unspecified type scar from previous cesarean delivery: Secondary | ICD-10-CM

## 2019-07-26 NOTE — OB Triage Note (Signed)
Pt is a 29 y/o G2P1 at [redacted]w[redacted]d with c/o rupture of membranes at 2045 while going to the bathroom. Pt some inconsistent cramping. Pt states +FM. Pt denies LOF, CTX and VB. Monitors applied and assessing. Initial FHT 135.

## 2019-07-27 DIAGNOSIS — O418X30 Other specified disorders of amniotic fluid and membranes, third trimester, not applicable or unspecified: Secondary | ICD-10-CM

## 2019-07-27 DIAGNOSIS — O34219 Maternal care for unspecified type scar from previous cesarean delivery: Secondary | ICD-10-CM | POA: Diagnosis not present

## 2019-07-27 DIAGNOSIS — Z3A36 36 weeks gestation of pregnancy: Secondary | ICD-10-CM | POA: Diagnosis not present

## 2019-07-27 LAB — RUPTURE OF MEMBRANE (ROM)PLUS: Rom Plus: NEGATIVE

## 2019-07-27 NOTE — OB Triage Note (Signed)
    L&D OB Triage Note  SUBJECTIVE Lindsay Mcdowell is a 29 y.o. G2P1001 female at [redacted]w[redacted]d, EDD Estimated Date of Delivery: 08/22/19 who presented to triage with complaints of leaking of fluid. Feels good movement and has occasional contractions.   OB History  Gravida Para Term Preterm AB Living  2 1 1  0 0 1  SAB TAB Ectopic Multiple Live Births  0 0 0 0 1    # Outcome Date GA Lbr Len/2nd Weight Sex Delivery Anes PTL Lv  2 Current           1 Term 01/14/17 [redacted]w[redacted]d  3920 g M CS-LTranv EPI  LIV     Name: Edling,BOY Madi     Apgar1: 8  Apgar5: 8    Medications Prior to Admission  Medication Sig Dispense Refill Last Dose  . Prenatal Vit-Fe Fumarate-FA (PRENATAL VITAMINS) 28-0.8 MG TABS Take by mouth.   Past Week at Unknown time     OBJECTIVE  Nursing Evaluation:   BP 127/78 (BP Location: Right Arm)   Pulse (!) 113   Temp 98.7 F (37.1 C) (Oral)   Resp 18   Ht 5\' 4"  (1.626 m)   Wt 93.4 kg   LMP 11/15/2018 (Exact Date)   BMI 35.36 kg/m    Findings:   Intact         NST was performed and has been reviewed by me.  NST INTERPRETATION: Category I Baseline 125 Moderate variability Accelerations present Decelerations absent Toco: irregular contractions  ASSESSMENT Impression:  1.  Pregnancy:  G2P1001 at [redacted]w[redacted]d , EDD Estimated Date of Delivery: 08/22/19 2.  Reassuring fetal and maternal status 3.  ROM plus negative   PLAN 1. Discussed current condition and above findings with patient and reassurance given.  All questions answered. 2. Discharge home with standard labor precautions given to return to L&D or call the office for problems. 3. Continue routine prenatal care.  [redacted]w[redacted]d, CNM

## 2019-07-30 ENCOUNTER — Other Ambulatory Visit: Payer: Self-pay

## 2019-07-30 ENCOUNTER — Ambulatory Visit (INDEPENDENT_AMBULATORY_CARE_PROVIDER_SITE_OTHER): Payer: Medicaid Other | Admitting: Certified Nurse Midwife

## 2019-07-30 ENCOUNTER — Encounter: Payer: Self-pay | Admitting: Certified Nurse Midwife

## 2019-07-30 VITALS — BP 112/64 | HR 91 | Wt 207.2 lb

## 2019-07-30 DIAGNOSIS — Z3493 Encounter for supervision of normal pregnancy, unspecified, third trimester: Secondary | ICD-10-CM | POA: Diagnosis not present

## 2019-07-30 DIAGNOSIS — Z3A36 36 weeks gestation of pregnancy: Secondary | ICD-10-CM

## 2019-07-30 LAB — POCT URINALYSIS DIPSTICK OB
Bilirubin, UA: NEGATIVE
Blood, UA: NEGATIVE
Glucose, UA: NEGATIVE
Ketones, UA: NEGATIVE
Leukocytes, UA: NEGATIVE
Nitrite, UA: NEGATIVE
POC,PROTEIN,UA: NEGATIVE
Spec Grav, UA: 1.02
Urobilinogen, UA: 0.2 U/dL
pH, UA: 6.5

## 2019-07-30 NOTE — Patient Instructions (Signed)
Braxton Hicks Contractions °Contractions of the uterus can occur throughout pregnancy, but they are not always a sign that you are in labor. You may have practice contractions called Braxton Hicks contractions. These false labor contractions are sometimes confused with true labor. °What are Braxton Hicks contractions? °Braxton Hicks contractions are tightening movements that occur in the muscles of the uterus before labor. Unlike true labor contractions, these contractions do not result in opening (dilation) and thinning of the cervix. Toward the end of pregnancy (32-34 weeks), Braxton Hicks contractions can happen more often and may become stronger. These contractions are sometimes difficult to tell apart from true labor because they can be very uncomfortable. You should not feel embarrassed if you go to the hospital with false labor. °Sometimes, the only way to tell if you are in true labor is for your health care provider to look for changes in the cervix. The health care provider will do a physical exam and may monitor your contractions. If you are not in true labor, the exam should show that your cervix is not dilating and your water has not broken. °If there are no other health problems associated with your pregnancy, it is completely safe for you to be sent home with false labor. You may continue to have Braxton Hicks contractions until you go into true labor. °How to tell the difference between true labor and false labor °True labor °· Contractions last 30-70 seconds. °· Contractions become very regular. °· Discomfort is usually felt in the top of the uterus, and it spreads to the lower abdomen and low back. °· Contractions do not go away with walking. °· Contractions usually become more intense and increase in frequency. °· The cervix dilates and gets thinner. °False labor °· Contractions are usually shorter and not as strong as true labor contractions. °· Contractions are usually irregular. °· Contractions  are often felt in the front of the lower abdomen and in the groin. °· Contractions may go away when you walk around or change positions while lying down. °· Contractions get weaker and are shorter-lasting as time goes on. °· The cervix usually does not dilate or become thin. °Follow these instructions at home: ° °· Take over-the-counter and prescription medicines only as told by your health care provider. °· Keep up with your usual exercises and follow other instructions from your health care provider. °· Eat and drink lightly if you think you are going into labor. °· If Braxton Hicks contractions are making you uncomfortable: °? Change your position from lying down or resting to walking, or change from walking to resting. °? Sit and rest in a tub of warm water. °? Drink enough fluid to keep your urine pale yellow. Dehydration may cause these contractions. °? Do slow and deep breathing several times an hour. °· Keep all follow-up prenatal visits as told by your health care provider. This is important. °Contact a health care provider if: °· You have a fever. °· You have continuous pain in your abdomen. °Get help right away if: °· Your contractions become stronger, more regular, and closer together. °· You have fluid leaking or gushing from your vagina. °· You pass blood-tinged mucus (bloody show). °· You have bleeding from your vagina. °· You have low back pain that you never had before. °· You feel your baby’s head pushing down and causing pelvic pressure. °· Your baby is not moving inside you as much as it used to. °Summary °· Contractions that occur before labor are   called Braxton Hicks contractions, false labor, or practice contractions. °· Braxton Hicks contractions are usually shorter, weaker, farther apart, and less regular than true labor contractions. True labor contractions usually become progressively stronger and regular, and they become more frequent. °· Manage discomfort from Braxton Hicks contractions  by changing position, resting in a warm bath, drinking plenty of water, or practicing deep breathing. °This information is not intended to replace advice given to you by your health care provider. Make sure you discuss any questions you have with your health care provider. °Document Revised: 03/25/2017 Document Reviewed: 08/26/2016 °Elsevier Patient Education © 2020 [employer] ° °

## 2019-07-30 NOTE — Progress Notes (Signed)
ROB doing well. Feels good movement. GBS and culture today . Declines SVE today. Leopold's vertex presentation. Follow up 1 wk.   [name], CNM

## 2019-08-01 LAB — STREP GP B NAA: Strep Gp B NAA: NEGATIVE

## 2019-08-01 LAB — GC/CHLAMYDIA PROBE AMP
Chlamydia trachomatis, NAA: NEGATIVE
Neisseria Gonorrhoeae by PCR: NEGATIVE

## 2019-08-06 ENCOUNTER — Ambulatory Visit (INDEPENDENT_AMBULATORY_CARE_PROVIDER_SITE_OTHER): Payer: Medicaid Other | Admitting: Certified Nurse Midwife

## 2019-08-06 ENCOUNTER — Encounter: Payer: Self-pay | Admitting: Certified Nurse Midwife

## 2019-08-06 ENCOUNTER — Other Ambulatory Visit: Payer: Self-pay

## 2019-08-06 VITALS — BP 136/85 | HR 102 | Wt 208.2 lb

## 2019-08-06 DIAGNOSIS — Z3493 Encounter for supervision of normal pregnancy, unspecified, third trimester: Secondary | ICD-10-CM

## 2019-08-06 NOTE — Progress Notes (Signed)
ROB doing well. Feels good movement. Discussed labor precautions. Follow up 1 wk for ROB.   [name], CNM

## 2019-08-06 NOTE — Patient Instructions (Signed)
Braxton Hicks Contractions °Contractions of the uterus can occur throughout pregnancy, but they are not always a sign that you are in labor. You may have practice contractions called Braxton Hicks contractions. These false labor contractions are sometimes confused with true labor. °What are Braxton Hicks contractions? °Braxton Hicks contractions are tightening movements that occur in the muscles of the uterus before labor. Unlike true labor contractions, these contractions do not result in opening (dilation) and thinning of the cervix. Toward the end of pregnancy (32-34 weeks), Braxton Hicks contractions can happen more often and may become stronger. These contractions are sometimes difficult to tell apart from true labor because they can be very uncomfortable. You should not feel embarrassed if you go to the hospital with false labor. °Sometimes, the only way to tell if you are in true labor is for your health care provider to look for changes in the cervix. The health care provider will do a physical exam and may monitor your contractions. If you are not in true labor, the exam should show that your cervix is not dilating and your water has not broken. °If there are no other health problems associated with your pregnancy, it is completely safe for you to be sent home with false labor. You may continue to have Braxton Hicks contractions until you go into true labor. °How to tell the difference between true labor and false labor °True labor °· Contractions last 30-70 seconds. °· Contractions become very regular. °· Discomfort is usually felt in the top of the uterus, and it spreads to the lower abdomen and low back. °· Contractions do not go away with walking. °· Contractions usually become more intense and increase in frequency. °· The cervix dilates and gets thinner. °False labor °· Contractions are usually shorter and not as strong as true labor contractions. °· Contractions are usually irregular. °· Contractions  are often felt in the front of the lower abdomen and in the groin. °· Contractions may go away when you walk around or change positions while lying down. °· Contractions get weaker and are shorter-lasting as time goes on. °· The cervix usually does not dilate or become thin. °Follow these instructions at home: ° °· Take over-the-counter and prescription medicines only as told by your health care provider. °· Keep up with your usual exercises and follow other instructions from your health care provider. °· Eat and drink lightly if you think you are going into labor. °· If Braxton Hicks contractions are making you uncomfortable: °? Change your position from lying down or resting to walking, or change from walking to resting. °? Sit and rest in a tub of warm water. °? Drink enough fluid to keep your urine pale yellow. Dehydration may cause these contractions. °? Do slow and deep breathing several times an hour. °· Keep all follow-up prenatal visits as told by your health care provider. This is important. °Contact a health care provider if: °· You have a fever. °· You have continuous pain in your abdomen. °Get help right away if: °· Your contractions become stronger, more regular, and closer together. °· You have fluid leaking or gushing from your vagina. °· You pass blood-tinged mucus (bloody show). °· You have bleeding from your vagina. °· You have low back pain that you never had before. °· You feel your baby’s head pushing down and causing pelvic pressure. °· Your baby is not moving inside you as much as it used to. °Summary °· Contractions that occur before labor are   called Braxton Hicks contractions, false labor, or practice contractions. °· Braxton Hicks contractions are usually shorter, weaker, farther apart, and less regular than true labor contractions. True labor contractions usually become progressively stronger and regular, and they become more frequent. °· Manage discomfort from Braxton Hicks contractions  by changing position, resting in a warm bath, drinking plenty of water, or practicing deep breathing. °This information is not intended to replace advice given to you by your health care provider. Make sure you discuss any questions you have with your health care provider. °Document Revised: 03/25/2017 Document Reviewed: 08/26/2016 °Elsevier Patient Education © 2020 [employer] ° °

## 2019-08-11 ENCOUNTER — Encounter
Admission: EM | Disposition: A | Discharge status: Home / Self Care | Payer: Self-pay | Source: Home / Self Care | Attending: Certified Nurse Midwife

## 2019-08-11 ENCOUNTER — Inpatient Hospital Stay
Admission: EM | Admit: 2019-08-11 | Discharge: 2019-08-13 | DRG: 788 | Disposition: A | Discharge status: Home / Self Care | Payer: Medicaid Other | Attending: Certified Nurse Midwife | Admitting: Certified Nurse Midwife

## 2019-08-11 ENCOUNTER — Inpatient Hospital Stay: Payer: Medicaid Other | Admitting: Certified Registered"

## 2019-08-11 ENCOUNTER — Encounter: Payer: Self-pay | Admitting: Obstetrics and Gynecology

## 2019-08-11 ENCOUNTER — Other Ambulatory Visit: Payer: Self-pay

## 2019-08-11 DIAGNOSIS — Z98891 History of uterine scar from previous surgery: Secondary | ICD-10-CM

## 2019-08-11 DIAGNOSIS — Z3A38 38 weeks gestation of pregnancy: Secondary | ICD-10-CM | POA: Diagnosis not present

## 2019-08-11 DIAGNOSIS — O335XX0 Maternal care for disproportion due to unusually large fetus, not applicable or unspecified: Secondary | ICD-10-CM

## 2019-08-11 DIAGNOSIS — R1084 Generalized abdominal pain: Secondary | ICD-10-CM | POA: Diagnosis not present

## 2019-08-11 DIAGNOSIS — Z20822 Contact with and (suspected) exposure to covid-19: Secondary | ICD-10-CM | POA: Diagnosis not present

## 2019-08-11 DIAGNOSIS — O339 Maternal care for disproportion, unspecified: Secondary | ICD-10-CM | POA: Diagnosis not present

## 2019-08-11 DIAGNOSIS — O3663X0 Maternal care for excessive fetal growth, third trimester, not applicable or unspecified: Secondary | ICD-10-CM | POA: Diagnosis not present

## 2019-08-11 DIAGNOSIS — O34211 Maternal care for low transverse scar from previous cesarean delivery: Secondary | ICD-10-CM | POA: Diagnosis not present

## 2019-08-11 DIAGNOSIS — O26893 Other specified pregnancy related conditions, third trimester: Secondary | ICD-10-CM | POA: Diagnosis not present

## 2019-08-11 DIAGNOSIS — O34219 Maternal care for unspecified type scar from previous cesarean delivery: Secondary | ICD-10-CM

## 2019-08-11 DIAGNOSIS — G8918 Other acute postprocedural pain: Secondary | ICD-10-CM | POA: Diagnosis not present

## 2019-08-11 LAB — CBC
HCT: 33.2 % — ABNORMAL LOW (ref 36.0–46.0)
Hemoglobin: 10.3 g/dL — ABNORMAL LOW (ref 12.0–15.0)
MCH: 25.4 pg — ABNORMAL LOW (ref 26.0–34.0)
MCHC: 31 g/dL (ref 30.0–36.0)
MCV: 82 fL (ref 80.0–100.0)
Platelets: 351 10*3/uL (ref 150–400)
RBC: 4.05 MIL/uL (ref 3.87–5.11)
RDW: 13.7 % (ref 11.5–15.5)
WBC: 17.8 10*3/uL — ABNORMAL HIGH (ref 4.0–10.5)
nRBC: 0 % (ref 0.0–0.2)

## 2019-08-11 LAB — TYPE AND SCREEN
ABO/RH(D): B POS
Antibody Screen: NEGATIVE

## 2019-08-11 LAB — RESPIRATORY PANEL BY RT PCR (FLU A&B, COVID)
Influenza A by PCR: NEGATIVE
Influenza B by PCR: NEGATIVE
SARS Coronavirus 2 by RT PCR: NEGATIVE

## 2019-08-11 SURGERY — Surgical Case
Anesthesia: Spinal

## 2019-08-11 MED ORDER — LACTATED RINGERS IV SOLN: INTRAVENOUS | Status: DC

## 2019-08-11 MED ORDER — FENTANYL CITRATE (PF) 100 MCG/2ML IJ SOLN: 50.0000 ug | INTRAMUSCULAR | Status: DC | PRN

## 2019-08-11 MED ORDER — OXYTOCIN 40 UNITS IN NORMAL SALINE INFUSION - SIMPLE MED
INTRAVENOUS | Status: AC
  Administered 2019-08-11: 2.5 [IU]/h via INTRAVENOUS
  Filled 2019-08-11: qty 1000

## 2019-08-11 MED ORDER — ONDANSETRON HCL 4 MG/2ML IJ SOLN: 4.0000 mg | Freq: Three times a day (TID) | INTRAMUSCULAR | Status: DC | PRN

## 2019-08-11 MED ORDER — PHENYLEPHRINE HCL (PRESSORS) 10 MG/ML IV SOLN
INTRAVENOUS | Status: AC
  Filled 2019-08-11: qty 1

## 2019-08-11 MED ORDER — FENTANYL CITRATE (PF) 100 MCG/2ML IJ SOLN
INTRAMUSCULAR | Status: DC | PRN
  Administered 2019-08-11: 15 ug via INTRATHECAL

## 2019-08-11 MED ORDER — ACETAMINOPHEN 500 MG PO TABS
1000.0000 mg | ORAL_TABLET | Freq: Four times a day (QID) | ORAL | Status: AC
  Administered 2019-08-11 – 2019-08-12 (×4): 1000 mg via ORAL
  Filled 2019-08-11 (×4): qty 2

## 2019-08-11 MED ORDER — MENTHOL 3 MG MT LOZG
1.0000 | LOZENGE | OROMUCOSAL | Status: DC | PRN
  Filled 2019-08-11: qty 9

## 2019-08-11 MED ORDER — AMMONIA AROMATIC IN INHA
RESPIRATORY_TRACT | Status: AC
  Filled 2019-08-11: qty 10

## 2019-08-11 MED ORDER — BUPIVACAINE IN DEXTROSE 0.75-8.25 % IT SOLN
INTRATHECAL | Status: DC | PRN
  Administered 2019-08-11: 1.6 mL via INTRATHECAL

## 2019-08-11 MED ORDER — NALBUPHINE HCL 10 MG/ML IJ SOLN: 5.0000 mg | Freq: Once | INTRAMUSCULAR | Status: DC | PRN

## 2019-08-11 MED ORDER — OXYTOCIN 40 UNITS IN NORMAL SALINE INFUSION - SIMPLE MED: 2.5000 [IU]/h | INTRAVENOUS | Status: AC

## 2019-08-11 MED ORDER — OXYTOCIN 10 UNIT/ML IJ SOLN
INTRAMUSCULAR | Status: AC
  Filled 2019-08-11: qty 2

## 2019-08-11 MED ORDER — ONDANSETRON HCL 4 MG/2ML IJ SOLN
4.0000 mg | Freq: Four times a day (QID) | INTRAMUSCULAR | Status: DC | PRN
  Administered 2019-08-11: 4 mg via INTRAVENOUS

## 2019-08-11 MED ORDER — LIDOCAINE HCL (PF) 1 % IJ SOLN
INTRAMUSCULAR | Status: AC
  Filled 2019-08-11: qty 30

## 2019-08-11 MED ORDER — NALOXONE HCL 4 MG/10ML IJ SOLN
1.0000 ug/kg/h | INTRAVENOUS | Status: DC | PRN
  Filled 2019-08-11: qty 5

## 2019-08-11 MED ORDER — PRENATAL MULTIVITAMIN CH
1.0000 | ORAL_TABLET | Freq: Every day | ORAL | Status: DC
  Administered 2019-08-11 – 2019-08-12 (×2): 1 via ORAL
  Filled 2019-08-11 (×2): qty 1

## 2019-08-11 MED ORDER — OXYTOCIN BOLUS FROM INFUSION: 500.0000 mL | Freq: Once | INTRAVENOUS | Status: DC

## 2019-08-11 MED ORDER — DIPHENHYDRAMINE HCL 25 MG PO CAPS: 25.0000 mg | ORAL_CAPSULE | Freq: Four times a day (QID) | ORAL | Status: DC | PRN

## 2019-08-11 MED ORDER — LIDOCAINE 5 % EX PTCH
MEDICATED_PATCH | CUTANEOUS | Status: DC | PRN
  Administered 2019-08-11: 1 via TRANSDERMAL

## 2019-08-11 MED ORDER — OXYCODONE-ACETAMINOPHEN 5-325 MG PO TABS
1.0000 | ORAL_TABLET | ORAL | Status: DC | PRN
  Filled 2019-08-11: qty 1

## 2019-08-11 MED ORDER — CEFAZOLIN SODIUM-DEXTROSE 2-4 GM/100ML-% IV SOLN
2.0000 g | INTRAVENOUS | Status: AC
  Administered 2019-08-11: 2 g via INTRAVENOUS
  Filled 2019-08-11: qty 100

## 2019-08-11 MED ORDER — OXYTOCIN 10 UNIT/ML IJ SOLN
INTRAMUSCULAR | Status: AC
  Filled 2019-08-11: qty 1

## 2019-08-11 MED ORDER — NALBUPHINE HCL 10 MG/ML IJ SOLN: 5.0000 mg | INTRAMUSCULAR | Status: DC | PRN

## 2019-08-11 MED ORDER — SCOPOLAMINE 1 MG/3DAYS TD PT72: 1.0000 | MEDICATED_PATCH | Freq: Once | TRANSDERMAL | Status: DC

## 2019-08-11 MED ORDER — ONDANSETRON HCL 4 MG/2ML IJ SOLN
INTRAMUSCULAR | Status: AC
  Filled 2019-08-11: qty 2

## 2019-08-11 MED ORDER — MISOPROSTOL 200 MCG PO TABS
ORAL_TABLET | ORAL | Status: AC
  Filled 2019-08-11: qty 4

## 2019-08-11 MED ORDER — DIPHENHYDRAMINE HCL 50 MG/ML IJ SOLN: 12.5000 mg | INTRAMUSCULAR | Status: DC | PRN

## 2019-08-11 MED ORDER — KETOROLAC TROMETHAMINE 30 MG/ML IJ SOLN
30.0000 mg | Freq: Four times a day (QID) | INTRAMUSCULAR | Status: AC
  Administered 2019-08-11 – 2019-08-12 (×4): 30 mg via INTRAVENOUS
  Filled 2019-08-11 (×4): qty 1

## 2019-08-11 MED ORDER — IBUPROFEN 800 MG PO TABS
800.0000 mg | ORAL_TABLET | Freq: Three times a day (TID) | ORAL | Status: DC
  Administered 2019-08-12 – 2019-08-13 (×3): 800 mg via ORAL
  Filled 2019-08-11 (×3): qty 1

## 2019-08-11 MED ORDER — OXYTOCIN 40 UNITS IN NORMAL SALINE INFUSION - SIMPLE MED
2.5000 [IU]/h | INTRAVENOUS | Status: DC
  Administered 2019-08-11: 10 [IU]/h via INTRAVENOUS
  Filled 2019-08-11: qty 1000

## 2019-08-11 MED ORDER — SODIUM CHLORIDE 0.9 % IV SOLN
INTRAVENOUS | Status: DC | PRN
  Administered 2019-08-11: 30 ug/min via INTRAVENOUS

## 2019-08-11 MED ORDER — LIDOCAINE HCL (PF) 1 % IJ SOLN: 30.0000 mL | INTRAMUSCULAR | Status: DC | PRN

## 2019-08-11 MED ORDER — SIMETHICONE 80 MG PO CHEW
80.0000 mg | CHEWABLE_TABLET | Freq: Four times a day (QID) | ORAL | Status: DC
  Administered 2019-08-11 – 2019-08-13 (×7): 80 mg via ORAL
  Filled 2019-08-11 (×7): qty 1

## 2019-08-11 MED ORDER — NALOXONE HCL 0.4 MG/ML IJ SOLN: 0.4000 mg | INTRAMUSCULAR | Status: DC | PRN

## 2019-08-11 MED ORDER — FENTANYL CITRATE (PF) 100 MCG/2ML IJ SOLN
INTRAMUSCULAR | Status: AC
  Filled 2019-08-11: qty 2

## 2019-08-11 MED ORDER — PHENYLEPHRINE HCL (PRESSORS) 10 MG/ML IV SOLN
INTRAVENOUS | Status: DC | PRN
  Administered 2019-08-11: 100 ug via INTRAVENOUS

## 2019-08-11 MED ORDER — LIDOCAINE 5 % EX PTCH
MEDICATED_PATCH | CUTANEOUS | Status: AC
  Filled 2019-08-11: qty 1

## 2019-08-11 MED ORDER — ZOLPIDEM TARTRATE 5 MG PO TABS: 5.0000 mg | ORAL_TABLET | Freq: Every evening | ORAL | Status: DC | PRN

## 2019-08-11 MED ORDER — SODIUM CHLORIDE 0.9 % IV SOLN
500.0000 mg | INTRAVENOUS | Status: DC
  Administered 2019-08-11: 500 mg via INTRAVENOUS
  Filled 2019-08-11: qty 500

## 2019-08-11 MED ORDER — SENNOSIDES-DOCUSATE SODIUM 8.6-50 MG PO TABS
2.0000 | ORAL_TABLET | ORAL | Status: DC
  Administered 2019-08-11 – 2019-08-13 (×2): 2 via ORAL
  Filled 2019-08-11 (×2): qty 2

## 2019-08-11 MED ORDER — ACETAMINOPHEN 325 MG PO TABS: 650.0000 mg | ORAL_TABLET | ORAL | Status: DC | PRN

## 2019-08-11 MED ORDER — MORPHINE SULFATE (PF) 0.5 MG/ML IJ SOLN
INTRAMUSCULAR | Status: AC
  Filled 2019-08-11: qty 10

## 2019-08-11 MED ORDER — DIPHENHYDRAMINE HCL 25 MG PO CAPS: 25.0000 mg | ORAL_CAPSULE | ORAL | Status: DC | PRN

## 2019-08-11 MED ORDER — MORPHINE SULFATE (PF) 0.5 MG/ML IJ SOLN
INTRAMUSCULAR | Status: DC | PRN
  Administered 2019-08-11: 100 ug via EPIDURAL

## 2019-08-11 MED ORDER — SODIUM CHLORIDE 0.9% FLUSH: 3.0000 mL | INTRAVENOUS | Status: DC | PRN

## 2019-08-11 MED ORDER — LACTATED RINGERS IV SOLN: 500.0000 mL | INTRAVENOUS | Status: DC | PRN

## 2019-08-11 MED ORDER — SOD CITRATE-CITRIC ACID 500-334 MG/5ML PO SOLN
ORAL | Status: AC
  Administered 2019-08-11: 30 mL
  Filled 2019-08-11: qty 30

## 2019-08-11 SURGICAL SUPPLY — 28 items
ADHESIVE MASTISOL STRL (MISCELLANEOUS) ×3 IMPLANT
BAG COUNTER SPONGE EZ (MISCELLANEOUS) ×2 IMPLANT
CANISTER SUCT 3000ML PPV (MISCELLANEOUS) ×3 IMPLANT
CHLORAPREP W/TINT 26 (MISCELLANEOUS) ×6 IMPLANT
COUNTER SPONGE BAG EZ (MISCELLANEOUS) ×1
COVER WAND RF STERILE (DRAPES) ×3 IMPLANT
DRSG TELFA 3X8 NADH (GAUZE/BANDAGES/DRESSINGS) ×3 IMPLANT
GAUZE 4X4 16PLY RFD (DISPOSABLE) ×3 IMPLANT
GAUZE SPONGE 4X4 12PLY STRL (GAUZE/BANDAGES/DRESSINGS) ×3 IMPLANT
GLOVE BIOGEL PI ORTHO PRO 7.5 (GLOVE) ×2
GLOVE PI ORTHO PRO STRL 7.5 (GLOVE) ×1 IMPLANT
GOWN STRL REUS W/ TWL LRG LVL3 (GOWN DISPOSABLE) ×2 IMPLANT
GOWN STRL REUS W/TWL LRG LVL3 (GOWN DISPOSABLE) ×4
KIT TURNOVER KIT A (KITS) ×3 IMPLANT
NS IRRIG 1000ML POUR BTL (IV SOLUTION) ×3 IMPLANT
PACK C SECTION AR (MISCELLANEOUS) ×3 IMPLANT
PAD OB MATERNITY 4.3X12.25 (PERSONAL CARE ITEMS) ×3 IMPLANT
PAD PREP 24X41 OB/GYN DISP (PERSONAL CARE ITEMS) ×3 IMPLANT
PENCIL SMOKE ULTRAEVAC 22 CON (MISCELLANEOUS) ×3 IMPLANT
RETRACTOR WND ALEXIS-O 25 LRG (MISCELLANEOUS) ×1 IMPLANT
RTRCTR C-SECT PINK 25CM LRG (MISCELLANEOUS) ×3 IMPLANT
RTRCTR WOUND ALEXIS O 25CM LRG (MISCELLANEOUS) ×3
SPONGE LAP 18X18 RF (DISPOSABLE) ×3 IMPLANT
SUT VIC AB 0 CTX 36 (SUTURE) ×4
SUT VIC AB 0 CTX36XBRD ANBCTRL (SUTURE) ×2 IMPLANT
SUT VIC AB 1 CT1 36 (SUTURE) ×6 IMPLANT
SUT VICRYL 3-0 36IN CTB-1 (SUTURE) ×3 IMPLANT
SUT VICRYL+ 3-0 36IN CT-1 (SUTURE) ×6 IMPLANT

## 2019-08-11 NOTE — Transfer of Care (Signed)
Immediate Anesthesia Transfer of Care Note  Patient: Lindsay Mcdowell  Procedure(s) Performed: CESAREAN SECTION (N/A )  Patient Location: Mother/Baby  Anesthesia Type:Spinal  Level of Consciousness: awake, alert  and oriented  Airway & Oxygen Therapy: Patient Spontanous Breathing  Post-op Assessment: Report given to RN and Post -op Vital signs reviewed and stable  Post vital signs: Reviewed and stable  Last Vitals:  Vitals Value Taken Time  BP 107/70 08/11/19 1050  Temp    Pulse 92 08/11/19 1054  Resp 20 08/11/19 1054  SpO2 99 % 08/11/19 1054  Vitals shown include unvalidated device data.  Last Pain:  Vitals:   08/11/19 0549  TempSrc: Axillary  PainSc:          Complications: No apparent anesthesia complications

## 2019-08-11 NOTE — H&P (Signed)
History and Physical   HPI  Lindsay Mcdowell is a 29 y.o. G2P1001 at [redacted]w[redacted]d Estimated Date of Delivery: 08/22/19 who is being admitted for labor management.   OB History  OB History  Gravida Para Term Preterm AB Living  2 1 1  0 0 1  SAB TAB Ectopic Multiple Live Births  0 0 0 0 1    # Outcome Date GA Lbr Len/2nd Weight Sex Delivery Anes PTL Lv  2 Current           1 Term 01/14/17 [redacted]w[redacted]d  3920 g M CS-LTranv EPI  LIV     Name: Sindoni,BOY Antoninette     Apgar1: 8  Apgar5: 8    PROBLEM LIST  Pregnancy complications or risks: Patient Active Problem List   Diagnosis Date Noted  . Labor and delivery, indication for care 07/27/2019  . Desires vaginal birth after cesarean trial 06/19/2019  . History of postpartum depression, currently pregnant 02/07/2019  . History of cesarean section 01/14/2017    Prenatal labs and studies: ABO, Rh: B/Positive/-- (10/08 1620) Antibody: Negative (10/08 1620) Rubella: <0.90 (10/08 1620) RPR: Non Reactive (02/03 0930)  HBsAg: Negative (10/08 1620)  HIV: Non Reactive (10/08 1620)  SWN:IOEVOJJK/-- (04/05 1624)   Past Medical History:  Diagnosis Date  . Depression   . Postpartum depression   . Vaginal Pap smear, abnormal      Past Surgical History:  Procedure Laterality Date  . CESAREAN SECTION N/A 01/14/2017   Procedure: CESAREAN SECTION;  Surgeon: Brayton Mars, MD;  Location: [facility] ORS;  Service: Obstetrics;  Laterality: N/A;  . none       Medications    Current Discharge Medication List    CONTINUE these medications which have NOT CHANGED   Details  Prenatal Vit-Fe Fumarate-FA (PRENATAL VITAMINS) 28-0.8 MG TABS Take by mouth.         Allergies  Codeine  Review of Systems  Constitutional: negative Eyes: negative Ears, nose, mouth, throat, and face: negative Respiratory: negative Cardiovascular: negative Gastrointestinal: negative Genitourinary:negative Integument/breast: negative Hematologic/lymphatic:  negative Musculoskeletal:negative Neurological: negative Behavioral/Psych: negative Endocrine: negative Allergic/Immunologic: negative  Physical Exam  BP (!) 144/80 (BP Location: Right Arm)   Pulse 93   Temp 98 F (36.7 C) (Axillary)   Resp 20   Ht 5\' 4"  (1.626 m)   Wt 94.5 kg   LMP 11/15/2018 (Exact Date)   SpO2 100%   BMI 35.75 kg/m   Lungs:  CTA B Cardio: RRR without M/R/G Abd: Soft, gravid, NT Presentation: cephalic EXT: No C/C/ 1+ Edema DTRs: 2+ B CERVIX: Dilation: 10 Dilation Complete Date: 08/11/19 Dilation Complete Time: [place]: 0 Presentation: Vertex Exam by:: Lamont Snowball RN  See Prenatal records for more detailed PE.     FHR:  Baseline: 120 bpm, Variability: Good {> 6 bpm), Accelerations: Reactive and Decelerations: varaiable   Toco: Uterine Contractions: Frequency: Every 2-4 minutes and Duration: 50-90 seconds  Test Results  Results for orders placed or performed during the hospital encounter of 08/11/19 (from the past 24 hour(s))  Respiratory Panel by RT PCR (Flu A&B, Covid) - Nasopharyngeal Swab     Status: None   Collection Time: 08/11/19  5:44 AM   Specimen: Nasopharyngeal Swab  Result Value Ref Range   SARS Coronavirus 2 by RT PCR NEGATIVE NEGATIVE   Influenza A by PCR NEGATIVE NEGATIVE   Influenza B by PCR NEGATIVE NEGATIVE   Group B Strep negative  Assessment   G2P1001 at [redacted]w[redacted]d  Estimated Date of Delivery: 08/22/19  The fetus is reassuring.   Patient Active Problem List   Diagnosis Date Noted  . Labor and delivery, indication for care 07/27/2019  . Desires vaginal birth after cesarean trial 06/19/2019  . History of postpartum depression, currently pregnant 02/07/2019  . History of cesarean section 01/14/2017    Plan  1. Admitted to L&D :   2. EFM:-- Category 1 3. Stadol or Epidural if desired.   4. Admission labs collected 5. TOLAC protocol activated 6.start pushing  [name], [facility] 08/11/2019 7:46 AM

## 2019-08-11 NOTE — Anesthesia Procedure Notes (Addendum)
Spinal  Patient location during procedure: OR Start time: 08/11/2019 9:08 AM End time: 08/11/2019 9:15 AM Staffing Performed: resident/CRNA  Resident/CRNA: Malayah Demuro L, CRNA Preanesthetic Checklist Completed: patient identified, IV checked, site marked, risks and benefits discussed, surgical consent, monitors and equipment checked, pre-op evaluation and timeout performed Spinal Block Patient position: sitting Prep: Betadine Patient monitoring: heart rate, continuous pulse ox, blood pressure and cardiac monitor Approach: midline Location: L4-5 Injection technique: single-shot Needle Needle type: Introducer and Pencan  Needle gauge: 24 G Needle length: 10 cm Assessment Sensory level: T4 Additional Notes Negative paresthesia. Negative blood return. Positive free-flowing CSF. Expiration date of kit checked and confirmed. Patient tolerated procedure well, without complications.  x2 attempts, 2 separate levels

## 2019-08-11 NOTE — Progress Notes (Signed)
LABOR NOTE   Lindsay Mcdowell 28 y.o.@ at [redacted]w[redacted]d  SUBJECTIVE:  Pushing, Dr. Logan Bores at the bedside to evaluate with vacuum assistance.  Analgesia: Labor support without medications  OBJECTIVE:  BP (!) 144/80 (BP Location: Right Arm)   Pulse 93   Temp 98 F (36.7 C) (Axillary)   Resp 20   Ht 5\' 4"  (1.626 m)   Wt 94.5 kg   LMP 11/15/2018 (Exact Date)   SpO2 100%   BMI 35.75 kg/m  No intake/output data recorded.  She has shown cervical change. CERVIX: 10: , pushing x 2.5 hrs. SVE:   Dilation: 10 Station: 0 Exam by:: dr [email or web address] CONTRACTIONS: regular, every 2-3 minutes FHR: Fetal heart tracing reviewed. Baseline: 115 bpm, Variability: Good {> 6 bpm), Accelerations: Non-reactive but appropriate for gestational age and Decelerations: variables with pushing Category II    Labs: Lab Results  Component Value Date   WBC 17.8 (H) 08/11/2019   HGB 10.3 (L) 08/11/2019   HCT 33.2 (L) 08/11/2019   MCV 82.0 08/11/2019   PLT 351 08/11/2019    ASSESSMENT: 1) Labor curve reviewed.       Progress: pushing     Membranes: ruptured, clear fluid          Active Problems:   Labor and delivery, indication for care   PLAN: Dr. 08/13/2019 at the bedside and evaluating pushing efforts and discussed use of vacuum. Given pt given good maternal effort and fetal head does not descend well, discussed with pt and her spouse that vacuum not likely to be successful but he (Dr. Logan Bores) offered to try. Pt declines and request to have repeat cesarean section. Pt consented, anesthesia notified.   Logan Bores, CNM  08/11/2019 8:37 AM

## 2019-08-11 NOTE — Progress Notes (Signed)
LABOR NOTE   Lindsay Mcdowell 28 y.o.@ at [redacted]w[redacted]d  SUBJECTIVE:  Pushing with contractions Analgesia: Labor support without medications  OBJECTIVE:  BP (!) 144/80 (BP Location: Right Arm)   Pulse 93   Temp 98 F (36.7 C) (Axillary)   Resp 20   Ht 5\' 4"  (1.626 m)   Wt 94.5 kg   LMP 11/15/2018 (Exact Date)   SpO2 100%   BMI 35.75 kg/m  No intake/output data recorded.  She has shown cervical change. CERVIX: 10:  100%:   0:   not felt: SVE:   Dilation: 10 Station: 0 Exam by:: B Darnell RN CONTRACTIONS: regular, every 2-4 minutes FHR: Fetal heart tracing reviewed. Baseline: 120 bpm, Variability: Good {> 6 bpm), Accelerations: Reactive and Decelerations: variables with pushing Category II   Labs: Lab Results  Component Value Date   WBC 8.8 05/30/2019   HGB 11.2 05/30/2019   HCT 34.4 05/30/2019   MCV 90 05/30/2019   PLT 311 05/30/2019    ASSESSMENT: 1) Labor curve reviewed.       Progress: Active phase labor.     Membranes: ruptured, clear fluid           Active Problems:   Labor and delivery, indication for care   PLAN: Pt pushing x 2 hrs. Discussed progress with pt. Discussed option of starting small amount of pitocin to increase contractions strength and frequency. Discussed option of calling attending MD for vacuum assistance and option to continue to push. Pt is discussing with her partner. Pt requesting to have attending evaluate for vacuum assistance   07/28/2019, [facility]  08/11/2019 7:49 AM

## 2019-08-11 NOTE — Op Note (Signed)
      OP NOTE  Date: 08/11/2019   11:03 AM Name Lindsay Mcdowell MR# [id number]  Preoperative Diagnosis: 1. Intrauterine pregnancy at [redacted]w[redacted]d Active Problems:   Labor and delivery, indication for care  2.  True CPD 3.  Prior CD for CPD  Postoperative Diagnosis: 1. Intrauterine pregnancy at [redacted]w[redacted]d, delivered 2. Viable infant 3. Macrosomic infant 4. Remainder same as pre-op   Procedure: 1. Repeat Low-Transverse Cesarean Section  Surgeon: Elonda Husky, MD  Assistant:  [name]  No other capable assistant available for this surgery which requires an experienced, high level assistant.  Anesthesia: None    EBL: 750  ml     Findings: 1) female infant, Apgar scores of 8   at 1 minute and 9   at 5 minutes and a birthweight of 147.8  ounces.   2) Head wedged deep in the pelvis 3) Omental, peritoneal and bladder flap adhesions to the ant aspect of the uterus 4) Normal uterus, tubes and ovaries.    Procedure:  The patient was prepped and draped in the supine position and placed under spinal anesthesia.  A transverse incision was made across the abdomen in a Pfannenstiel manner. If indicated the old scar was systematically removed with sharp dissection.  We carried the dissection down to the level of the fascia.  The fascia was incised in a curvilinear manner.  The fascia was then elevated from the rectus muscles with blunt and sharp dissection.  The rectus muscles were separated laterally exposing the peritoneum.  The peritoneum was carefully entered with care being taken to avoid bowel and bladder. Lysis of adhesions was performed to remove multiple adhesion attached to the ant aspect of the uterus.  A self-retaining retractor was placed.  The visceral peritoneum was incised in a curvilinear fashion across the lower uterine segment creating a bladder flap. A transverse incision was made in the lower uterine segment and it immediately extended laterally following the old very attenuated  uterine scar. The fetal vertix was carefully extracted from its lodged position in the pelvis. The infant was delivered from the cephalic position.  A nuchal cord was not present. After an appropriate time interval, the cord was doubly clamped and cut. Cord blood was obtained if required.  The infant was handed to the pediatric personnel  who then placed the infant under heat lamps where it was cleaned dried and suctioned as needed. The placenta was delivered. The hysterotomy incision was then identified on ring forceps including a small extension on the right sided dipping toward the vagina. The uterine cavity was cleaned with a moist lap sponge.  The hysterotomy incision was closed with a running interlocking suture of Vicryl.  Several additional figure of 8 sutures were placed to provide hemostasis and approximation.  Hemostasis was excellent.  Pitocin was run in the IV and the uterus was found to be firm. The posterior cul-de-sac and gutters were cleaned and inspected.  Hemostasis was again noted.  The fascia was then closed with a running suture of #1 Vicryl.  Hemostasis of the subcutaneous tissues was obtained using the Bovie.  The subcutaneous tissues were closed with a running suture of 000 Vicryl.  A subcuticular suture was placed.  Steri-Strips were applied in the usual manner.  A pressure dressing was placed.  The patient went to the recovery room in stable condition.   Elonda Husky, M.D. 08/11/2019 11:03 AM

## 2019-08-11 NOTE — Anesthesia Preprocedure Evaluation (Signed)
Anesthesia Evaluation  Patient identified by MRN, date of birth, ID band Patient awake    Reviewed: Allergy & Precautions, H&P , NPO status , Patient's Chart, lab work & pertinent test results  Airway Mallampati: III  TM Distance: >3 FB Neck ROM: full    Dental  (+) Teeth Intact   Pulmonary neg pulmonary ROS,           Cardiovascular Exercise Tolerance: Good (-) hypertensionnegative cardio ROS       Neuro/Psych PSYCHIATRIC DISORDERS Depression    GI/Hepatic negative GI ROS,   Endo/Other    Renal/GU   negative genitourinary   Musculoskeletal   Abdominal   Peds  Hematology negative hematology ROS (+)   Anesthesia Other Findings Unsuccessful second stage of labor despite 2 hours of pushing, h/o failed forceps delivery, proceeding to C/S with spinal  Past Medical History: No date: Depression No date: Postpartum depression No date: Vaginal Pap smear, abnormal  Past Surgical History: 01/14/2017: CESAREAN SECTION; N/A     Comment:  Procedure: CESAREAN SECTION;  Surgeon: Herold Harms, MD;  Location: [facility] ORS;  Service: Obstetrics;               Laterality: N/A; No date: none  BMI    Body Mass Index: 35.75 kg/m      Reproductive/Obstetrics (+) Pregnancy                            Anesthesia Physical Anesthesia Plan  ASA: II  Anesthesia Plan: Spinal   Post-op Pain Management:    Induction:   PONV Risk Score and Plan:   Airway Management Planned:   Additional Equipment:   Intra-op Plan:   Post-operative Plan:   Informed Consent: I have reviewed the patients History and Physical, chart, labs and discussed the procedure including the risks, benefits and alternatives for the proposed anesthesia with the patient or authorized representative who has indicated his/her understanding and acceptance.     Dental Advisory Given  Plan Discussed with:  Anesthesiologist  Anesthesia Plan Comments:        Anesthesia Quick Evaluation

## 2019-08-11 NOTE — Progress Notes (Signed)
TOLAC protocol initiated. OR notified at 36, Nursing Supervisor notified at 0532, AnesthesiaSuzan Slick, MD notified at [phone]. Charlena Cross, MD notified at [phone].  A.Janee Morn, CNM at bedside.

## 2019-08-12 LAB — RPR: RPR Ser Ql: NONREACTIVE

## 2019-08-12 MED ORDER — OXYCODONE HCL 5 MG PO TABS
5.0000 mg | ORAL_TABLET | ORAL | Status: DC | PRN
  Administered 2019-08-12 (×2): 10 mg via ORAL
  Administered 2019-08-12: 5 mg via ORAL
  Administered 2019-08-13 (×2): 10 mg via ORAL
  Filled 2019-08-12: qty 1
  Filled 2019-08-12 (×4): qty 2

## 2019-08-12 NOTE — Progress Notes (Signed)
Patient ID: Lindsay Mcdowell, female   DOB: 1991/02/28, 29 y.o.   MRN: [id number]    Progress Note - Cesarean Delivery  Lindsay Mcdowell is a 29 y.o. G2P2002 now PP day 1 s/p C-Section, Low Transverse .   Subjective:  Patient reports no problems with eating, bowel movements, voiding, or their wound  Pain controlled  Objective:  Vital signs in last 24 hours: Temp:  [97.6 F (36.4 C)-98.6 F (37 C)] 98.5 F (36.9 C) (04/18 0809) Pulse Rate:  [71-115] 82 (04/18 0809) Resp:  [14-47] 18 (04/18 0809) BP: (101-117)/(55-74) 110/70 (04/18 0809) SpO2:  [62 %-100 %] 98 % (04/18 0809)  Physical Exam:  General: alert, cooperative and no distress Lochia: appropriate Uterine Fundus: firm Incision: dressing intact    Data Review Recent Labs    08/11/19 0736  HGB 10.3*  HCT 33.2*    Assessment:  Active Problems:   Labor and delivery, indication for care   Status post Cesarean section. Doing well postoperatively.     Plan:       Continue current care.  OOB, Shower, Dressing removal  D/C tomorrow  Elonda Husky, M.D. 08/12/2019 10:15 AM

## 2019-08-12 NOTE — Lactation Note (Signed)
This note was copied from a baby's chart. Lactation Consultation Note  Patient Name: Lindsay Mcdowell EHUDJ'S Date: 08/12/2019 Reason for consult: Follow-up assessment;Early term 37-38.6wks  Observed breastfeeding.  He latches well and begins strong, rhythmic sucking with swallows heard.  Mom weaned from breast feeding her 5 yr 39 month old just 3 months ago.   With this baby she does not always use good pillow support and lets him slip to the tip of the nipple.  Mom denies any breast or nipple pain.  Still encouraged mom to keep him deeply latched explaining that a shallow latch could cause sore nipples and less exchange of milk.  He cluster fed last night and had lots of voids and bowel movements.  Praised mom for her committment to put him to the breast whenever he demonstrates feeding cues.  Reviewed normal newborn stomach size, supply and demand, normal course of lactation and routine newborn feeding patterns.  Lactation name and number written on white board and encouraged to call with any questions, concerns or assistance.   Maternal Data Formula Feeding for Exclusion: No Has patient been taught Hand Expression?: Yes Does the patient have breastfeeding experience prior to this delivery?: Yes  Feeding Feeding Type: Breast Fed  LATCH Score Latch: Grasps breast easily, tongue down, lips flanged, rhythmical sucking.  Audible Swallowing: A few with stimulation  Type of Nipple: Everted at rest and after stimulation  Comfort (Breast/Nipple): Soft / non-tender  Hold (Positioning): No assistance needed to correctly position infant at breast.  LATCH Score: 9  Interventions Interventions: Breast massage;Breast compression;Support pillows;Position options  Lactation Tools Discussed/Used WIC Program: Yes   Consult Status Consult Status: PRN Follow-up type: Call as needed    Louis Meckel 08/12/2019, 3:22 PM

## 2019-08-12 NOTE — Anesthesia Postprocedure Evaluation (Signed)
Anesthesia Post Note  Patient: Lindsay Mcdowell  Procedure(s) Performed: CESAREAN SECTION (N/A )  Patient location during evaluation: Mother Baby Anesthesia Type: Spinal Level of consciousness: oriented and awake and alert Pain management: pain level controlled Vital Signs Assessment: post-procedure vital signs reviewed and stable Respiratory status: spontaneous breathing Cardiovascular status: blood pressure returned to baseline and stable Postop Assessment: no headache, no backache, no apparent nausea or vomiting, patient able to bend at knees and able to ambulate Anesthetic complications: no     Last Vitals:  Vitals:   08/12/19 0700 08/12/19 0809  BP:  110/70  Pulse: 83 82  Resp:  18  Temp:  36.9 C  SpO2: 97% 98%    Last Pain:  Vitals:   08/12/19 0840  TempSrc:   PainSc: 0-No pain                 Cleda Mccreedy Carrin Vannostrand

## 2019-08-12 NOTE — Anesthesia Post-op Follow-up Note (Signed)
  Anesthesia Pain Follow-up Note  Patient: Lindsay Mcdowell  Day #: 1  Date of Follow-up: 08/12/2019 Time: 9:44 AM  Last Vitals:  Vitals:   08/12/19 0700 08/12/19 0809  BP:  110/70  Pulse: 83 82  Resp:  18  Temp:  36.9 C  SpO2: 97% 98%    Level of Consciousness: alert  Pain: mild   Side Effects:None  Catheter Site Exam:clean, dry     Plan: D/C from anesthesia care at surgeon's request  Cleda Mccreedy Jaremy Nosal

## 2019-08-13 MED ORDER — IBUPROFEN 800 MG PO TABS: 800.0000 mg | ORAL_TABLET | Freq: Three times a day (TID) | ORAL | 0 refills | Status: DC

## 2019-08-13 MED ORDER — OXYCODONE HCL 5 MG PO TABS: 5.0000 mg | ORAL_TABLET | ORAL | 0 refills | Status: DC | PRN

## 2019-08-13 NOTE — Discharge Summary (Signed)
Physician Obstetric Discharge Summary  Patient Name: Lindsay Mcdowell DOB: 03/10/1991 MRN: [id number]                            Discharge Summary  Date of Admission: 08/11/2019 Date of Discharge: 08/13/2019 Delivering Provider: Linzie Collin   Admitting Diagnosis: Labor and delivery, indication for care [O75.9] at [redacted]w[redacted]d Secondary diagnosis:  Active Problems:   Labor and delivery, indication for care   Mode of Delivery:       low uterine, transverse, repeat     Discharge diagnosis: Term Pregnancy Delivered      Intrapartum Procedures: None   Post partum procedures: none  Complications: none                     Discharge Day SOAP Note:  Subjective:  The patient has no complaints.  She is ambulating well. She is taking PO well. Pain is well controlled with current medications. Patient is urinating without difficulty.   She is passing flatus.    Objective  Vital signs: BP (!) 105/55   Pulse 95   Temp 98.4 F (36.9 C)   Resp 20   Ht 5\' 4"  (1.626 m)   Wt 94.5 kg   LMP 11/15/2018 (Exact Date)   SpO2 99%   Breastfeeding Unknown   BMI 35.75 kg/m   Physical Exam: Gen: NAD Abdomen:  clean, dry, healing Fundus Fundal Tone: Firm @ U  Lochia Amount: Small     Data Review Labs: Lab Results  Component Value Date   WBC 17.8 (H) 08/11/2019   HGB 10.3 (L) 08/11/2019   HCT 33.2 (L) 08/11/2019   MCV 82.0 08/11/2019   PLT 351 08/11/2019   CBC Latest Ref Rng & Units 08/11/2019 05/30/2019 02/07/2019  WBC 4.0 - 10.5 K/uL 17.8(H) 8.8 10.1  Hemoglobin 12.0 - 15.0 g/dL 10.3(L) 11.2 13.0  Hematocrit 36.0 - 46.0 % 33.2(L) 34.4 39.6  Platelets 150 - 400 K/uL 351 311 338   B POS  Edinburgh Score: Edinburgh Postnatal Depression Scale Screening Tool 08/12/2019  I have been able to laugh and see the funny side of things. 0  I have looked forward with enjoyment to things. 0  I have blamed myself unnecessarily when things went wrong. 0  I have been anxious or worried for no  good reason. 1  I have felt scared or panicky for no good reason. 1  Things have been getting on top of me. 1  I have been so unhappy that I have had difficulty sleeping. 0  I have felt sad or miserable. 0  I have been so unhappy that I have been crying. 0  The thought of harming myself has occurred to me. 0  Edinburgh Postnatal Depression Scale Total 3    Assessment:  Active Problems:   Labor and delivery, indication for care   Doing well.  Normal progress as expected.  Plan:  Discharge to home  Modified rest as directed - may slowly resume normal activities with restrictions  as discussed.  Medications as written.  See below for additional.      Discharge Instructions: Per After Visit Summary. Activity: Advance as tolerated. Pelvic rest for 6 weeks.  Also refer to After Visit Summary.  Wound care discussed. Diet: Regular Medications: Allergies as of 08/13/2019      Reactions   Codeine Itching   Pt unsure of reaction, fhx of allergy  Medication List    TAKE these medications   ibuprofen 800 MG tablet Commonly known as: ADVIL Take 1 tablet (800 mg total) by mouth every 8 (eight) hours.   oxyCODONE 5 MG immediate release tablet Commonly known as: Oxy IR/ROXICODONE Take 1-2 tablets (5-10 mg total) by mouth every 4 (four) hours as needed for moderate pain or severe pain.   Prenatal Vitamins 28-0.8 MG Tabs Take by mouth.      Outpatient follow up: 1 wk wound check, 2 wk tele visit, 6 wk ppv with Philip Aspen, CNM  Postpartum contraception: Will discuss at first post-partum visit.  Discharged Condition: good  Discharged to: home  Newborn Data: Disposition:home with mother  Apgars: APGAR (1 MIN): 8   APGAR (5 MINS): 9   APGAR (10 MINS):    Baby Feeding: Breast  Philip Aspen, CNM  08/13/2019 4:10 AM

## 2019-08-13 NOTE — Discharge Instructions (Signed)
Discharge Instructions:   Follow-up Appointment: Call ASAP and schedule a follow-up postpartum visit in 6 weeks!   If there are any new medications, they have been ordered and will be available for pickup at the listed pharmacy on your way home from the hospital.   Call office if you have any of the following: headache, visual changes, fever >101.0 F, chills, shortness of breath, breast concerns, excessive vaginal bleeding, incision drainage or problems, leg pain or redness, depression or any other concerns. If you have vaginal discharge with an odor, let your doctor know.   It is normal to bleed for up to 6 weeks. You should not soak through more than 1 pad in 1 hour. If you have a blood clot larger than your fist with continued bleeding, call your doctor.   Activity: Do not lift > 10 lbs for 6 weeks (do not lift anything heavier than your baby). No intercourse, tampons, swimming pools, hot tubs, baths (only showers) for 6 weeks.  No driving for 1-2 weeks. Continue prenatal vitamin, especially if breastfeeding. Increase calories and fluids (water) while breastfeeding.   Your milk will come in, in the next couple of days (right now it is colostrum). You may have a slight fever when your milk comes in, but it should go away on its own.  If it does not, and rises above 101 F please call the doctor. You will also feel achy and your breasts will be firm. They will also start to leak. If you are breastfeeding, continue as you have been and you can pump/express milk for comfort.   If you have too much milk, your breasts can become engorged, which could lead to mastitis. This is an infection of the milk ducts. It can be very painful and you will need to notify your doctor to obtain a prescription for antibiotics. You can also treat it with a shower or hot/cold compress.   For concerns about your baby, please call your pediatrician.  For breastfeeding concerns, the lactation consultant can be reached at  [phone].   Postpartum blues (feelings of happy one minute and sad another minute) are normal for the first few weeks but if it gets worse let your doctor know.   Congratulations! We enjoyed caring for you and your new bundle of joy!

## 2019-08-13 NOTE — Progress Notes (Signed)
Pt rubella non-immune, patient refused vaccine.

## 2019-08-13 NOTE — Final Progress Note (Signed)
Discharge Day SOAP Note:  Subjective:  The patient has no complaints.  She is ambulating well. She is taking PO well. Pain is well controlled with current medications. Patient is urinating without difficulty.   She is passing flatus.    Objective  Vital signs: BP (!) 105/55   Pulse 95   Temp 98.4 F (36.9 C)   Resp 20   Ht 5\' 4"  (1.626 m)   Wt 94.5 kg   LMP 11/15/2018 (Exact Date)   SpO2 99%   Breastfeeding Unknown   BMI 35.75 kg/m   Physical Exam: Gen: NAD Abdomen:  clean, dry, healing Fundus Fundal Tone: Firm @ U  Lochia Amount: Small     Data Review Labs: Lab Results  Component Value Date   WBC 17.8 (H) 08/11/2019   HGB 10.3 (L) 08/11/2019   HCT 33.2 (L) 08/11/2019   MCV 82.0 08/11/2019   PLT 351 08/11/2019   CBC Latest Ref Rng & Units 08/11/2019 05/30/2019 02/07/2019  WBC 4.0 - 10.5 K/uL 17.8(H) 8.8 10.1  Hemoglobin 12.0 - 15.0 g/dL 10.3(L) 11.2 13.0  Hematocrit 36.0 - 46.0 % 33.2(L) 34.4 39.6  Platelets 150 - 400 K/uL 351 311 338   B POS  Edinburgh Score: Edinburgh Postnatal Depression Scale Screening Tool 08/12/2019  I have been able to laugh and see the funny side of things. 0  I have looked forward with enjoyment to things. 0  I have blamed myself unnecessarily when things went wrong. 0  I have been anxious or worried for no good reason. 1  I have felt scared or panicky for no good reason. 1  Things have been getting on top of me. 1  I have been so unhappy that I have had difficulty sleeping. 0  I have felt sad or miserable. 0  I have been so unhappy that I have been crying. 0  The thought of harming myself has occurred to me. 0  Edinburgh Postnatal Depression Scale Total 3    Assessment:  Active Problems:   Labor and delivery, indication for care   Doing well.  Normal progress as expected.  Plan:  Discharge to home  Modified rest as directed - may slowly resume normal activities with restrictions  as discussed.  Medications as  written.  See below for additional.      Discharge Instructions: Per After Visit Summary. Activity: Advance as tolerated. Pelvic rest for 6 weeks.  Also refer to After Visit Summary.  Wound care discussed. Diet: Regular Medications: Allergies as of 08/13/2019      Reactions   Codeine Itching   Pt unsure of reaction, fhx of allergy      Medication List    TAKE these medications   ibuprofen 800 MG tablet Commonly known as: ADVIL Take 1 tablet (800 mg total) by mouth every 8 (eight) hours.   oxyCODONE 5 MG immediate release tablet Commonly known as: Oxy IR/ROXICODONE Take 1-2 tablets (5-10 mg total) by mouth every 4 (four) hours as needed for moderate pain or severe pain.   Prenatal Vitamins 28-0.8 MG Tabs Take by mouth.      Outpatient follow up: 1 wk wound check, 2 wk tele visit, 6 wk ppv with 08/15/2019, CNM  Postpartum contraception: Will discuss at first post-partum visit.  Discharged Condition: good  Discharged to: home  Newborn Data: Disposition:home with mother  Apgars: APGAR (1 MIN): 8   APGAR (5 MINS): 9   APGAR (10 MINS):    Baby Feeding: Breast  Philip Aspen, CNM  08/13/2019 4:10 AM

## 2019-08-13 NOTE — Progress Notes (Signed)
Pt discharged with infant. Discharge instructions, prescriptions, and follow up appointments given to and reviewed with patient. Pt verbalized understanding. To be escorted out by auxillary.  °

## 2019-08-14 ENCOUNTER — Telehealth: Payer: Self-pay | Admitting: Certified Nurse Midwife

## 2019-08-14 ENCOUNTER — Telehealth: Payer: Self-pay

## 2019-08-14 ENCOUNTER — Encounter: Payer: Medicaid Other | Admitting: Certified Nurse Midwife

## 2019-08-14 NOTE — Telephone Encounter (Signed)
Patient didn't receive an care packet for her c-section aftercare. Patient isn't educated on how to care for her incision. Could you please advise?

## 2019-08-14 NOTE — Telephone Encounter (Signed)
Mychart message sent to patient regarding telephone call received about aftercare for c-section incision.

## 2019-08-20 ENCOUNTER — Encounter: Payer: Self-pay | Admitting: Certified Nurse Midwife

## 2019-08-20 ENCOUNTER — Other Ambulatory Visit: Payer: Self-pay

## 2019-08-20 ENCOUNTER — Ambulatory Visit (INDEPENDENT_AMBULATORY_CARE_PROVIDER_SITE_OTHER): Payer: Medicaid Other | Admitting: Certified Nurse Midwife

## 2019-08-20 VITALS — BP 127/84 | HR 94 | Ht 64.0 in | Wt 188.2 lb

## 2019-08-20 DIAGNOSIS — Z5189 Encounter for other specified aftercare: Secondary | ICD-10-CM

## 2019-08-20 NOTE — Progress Notes (Signed)
    OBSTETRICS/GYNECOLOGY POST-OPERATIVE CLINIC VISIT  Subjective:     Lindsay Mcdowell is a 29 y.o. female who presents to the clinic 1 weeks status post repeat cesarean section  for failure to descend. Eating a regular diet without difficulty. Bowel movements are normal. The patient is not having any pain.  The following portions of the patient's history were reviewed and updated as appropriate: allergies, current medications, past family history, past medical history, past social history, past surgical history and problem list.  Review of Systems Pertinent items are noted in HPI.    Objective:    BP 127/84   Pulse 94   Ht 5\' 4"  (1.626 m)   Wt 188 lb 4 oz (85.4 kg)   BMI 32.31 kg/m  General:  alert and no distress  Abdomen: soft, bowel sounds active, non-tender  Incision:   healing well, no drainage, no erythema, no hernia, no seroma, no swelling, no dehiscence, incision well approximated    Pathology:    Assessment:    Doing well postoperatively.   Plan:   1. Continue any current medications. 2. Wound care discussed. 3. Operative findings again reviewed. Pathology report discussed. 4. Activity restrictions: no gym class and no lifting more than 10 pounds 5. Anticipated return to work: not applicable. 6. Follow up: 5 weeks for postpartum check     , CNM [facility]

## 2019-08-20 NOTE — Patient Instructions (Signed)
Wound Care, Adult Taking care of your wound properly can help to prevent pain, infection, and scarring. It can also help your wound to heal more quickly. How to care for your wound Wound care      Follow instructions from your health care provider about how to take care of your wound. Make sure you: ? Wash your hands with soap and water before you change the bandage (dressing). If soap and water are not available, use hand sanitizer. ? Change your dressing as told by your health care provider. ? Leave stitches (sutures), skin glue, or adhesive strips in place. These skin closures may need to stay in place for 2 weeks or longer. If adhesive strip edges start to loosen and curl up, you may trim the loose edges. Do not remove adhesive strips completely unless your health care provider tells you to do that.  Check your wound area every day for signs of infection. Check for: ? Redness, swelling, or pain. ? Fluid or blood. ? Warmth. ? Pus or a bad smell.  Ask your health care provider if you should clean the wound with mild soap and water. Doing this may include: ? Using a clean towel to pat the wound dry after cleaning it. Do not rub or scrub the wound. ? Applying a cream or ointment. Do this only as told by your health care provider. ? Covering the incision with a clean dressing.  Ask your health care provider when you can leave the wound uncovered.  Keep the dressing dry until your health care provider says it can be removed. Do not take baths, swim, use a hot tub, or do anything that would put the wound underwater until your health care provider approves. Ask your health care provider if you can take showers. You may only be allowed to take sponge baths. Medicines   If you were prescribed an antibiotic medicine, cream, or ointment, take or use the antibiotic as told by your health care provider. Do not stop taking or using the antibiotic even if your condition improves.  Take  over-the-counter and prescription medicines only as told by your health care provider. If you were prescribed pain medicine, take it 30 or more minutes before you do any wound care or as told by your health care provider. General instructions  Return to your normal activities as told by your health care provider. Ask your health care provider what activities are safe.  Do not scratch or pick at the wound.  Do not use any products that contain nicotine or tobacco, such as cigarettes and e-cigarettes. These may delay wound healing. If you need help quitting, ask your health care provider.  Keep all follow-up visits as told by your health care provider. This is important.  Eat a diet that includes protein, vitamin A, vitamin C, and other nutrient-rich foods to help the wound heal. ? Foods rich in protein include meat, dairy, beans, nuts, and other sources. ? Foods rich in vitamin A include carrots and dark green, leafy vegetables. ? Foods rich in vitamin C include citrus, tomatoes, and other fruits and vegetables. ? Nutrient-rich foods have protein, carbohydrates, fat, vitamins, or minerals. Eat a variety of healthy foods including vegetables, fruits, and whole grains. Contact a health care provider if:  You received a tetanus shot and you have swelling, severe pain, redness, or bleeding at the injection site.  Your pain is not controlled with medicine.  You have redness, swelling, or pain around the wound.    You have fluid or blood coming from the wound.  Your wound feels warm to the touch.  You have pus or a bad smell coming from the wound.  You have a fever or chills.  You are nauseous or you vomit.  You are dizzy. Get help right away if:  You have a red streak going away from your wound.  The edges of the wound open up and separate.  Your wound is bleeding, and the bleeding does not stop with gentle pressure.  You have a rash.  You faint.  You have trouble  breathing. Summary  Always wash your hands with soap and water before changing your bandage (dressing).  To help with healing, eat foods that are rich in protein, vitamin A, vitamin C, and other nutrients.  Check your wound every day for signs of infection. Contact your health care provider if you suspect that your wound is infected. This information is not intended to replace advice given to you by your health care provider. Make sure you discuss any questions you have with your health care provider. Document Revised: 07/31/2018 Document Reviewed: 10/28/2015 Elsevier Patient Education  2020 [employer]  

## 2019-08-22 ENCOUNTER — Inpatient Hospital Stay: Admit: 2019-08-22 | Payer: Self-pay

## 2019-08-29 ENCOUNTER — Telehealth: Payer: Self-pay

## 2019-08-29 ENCOUNTER — Encounter: Payer: Medicaid Other | Admitting: Certified Nurse Midwife

## 2019-08-29 NOTE — Telephone Encounter (Signed)
Patient had a 2 week televisit scheduled. However [name] CNM got called to the hospital for a delivery. A call was placed to patient and the situation was explained to her. She states she is doing well and we can cancel the televisit. Patient was seen in the office 08/20/19 for a wound check. Patient states she would like to just come back in to the office for her post partum visit. She was encouraged to reach out if anything changes and she has concerns. Patient was in agreement.

## 2019-09-26 ENCOUNTER — Encounter: Payer: Self-pay | Admitting: Certified Nurse Midwife

## 2019-09-26 ENCOUNTER — Other Ambulatory Visit: Payer: Self-pay

## 2019-09-26 ENCOUNTER — Ambulatory Visit (INDEPENDENT_AMBULATORY_CARE_PROVIDER_SITE_OTHER): Payer: Medicaid Other | Admitting: Certified Nurse Midwife

## 2019-09-26 NOTE — Patient Instructions (Signed)
Preventive Care 21-30 Years Old, Female Preventive care refers to visits with your health care provider and lifestyle choices that can promote health and wellness. This includes:  A yearly physical exam. This may also be called an annual well check.  Regular dental visits and eye exams.  Immunizations.  Screening for certain conditions.  Healthy lifestyle choices, such as eating a healthy diet, getting regular exercise, not using drugs or products that contain nicotine and tobacco, and limiting alcohol use. What can I expect for my preventive care visit? Physical exam Your health care provider will check your:  Height and weight. This may be used to calculate body mass index (BMI), which tells if you are at a healthy weight.  Heart rate and blood pressure.  Skin for abnormal spots. Counseling Your health care provider may ask you questions about your:  Alcohol, tobacco, and drug use.  Emotional well-being.  Home and relationship well-being.  Sexual activity.  Eating habits.  Work and work environment.  Method of birth control.  Menstrual cycle.  Pregnancy history. What immunizations do I need?  Influenza (flu) vaccine  This is recommended every year. Tetanus, diphtheria, and pertussis (Tdap) vaccine  You may need a Td booster every 10 years. Varicella (chickenpox) vaccine  You may need this if you have not been vaccinated. Human papillomavirus (HPV) vaccine  If recommended by your health care provider, you may need three doses over 6 months. Measles, mumps, and rubella (MMR) vaccine  You may need at least one dose of MMR. You may also need a second dose. Meningococcal conjugate (MenACWY) vaccine  One dose is recommended if you are age 19-21 years and a first-year college student living in a residence hall, or if you have one of several medical conditions. You may also need additional booster doses. Pneumococcal conjugate (PCV13) vaccine  You may need  this if you have certain conditions and were not previously vaccinated. Pneumococcal polysaccharide (PPSV23) vaccine  You may need one or two doses if you smoke cigarettes or if you have certain conditions. Hepatitis A vaccine  You may need this if you have certain conditions or if you travel or work in places where you may be exposed to hepatitis A. Hepatitis B vaccine  You may need this if you have certain conditions or if you travel or work in places where you may be exposed to hepatitis B. Haemophilus influenzae type b (Hib) vaccine  You may need this if you have certain conditions. You may receive vaccines as individual doses or as more than one vaccine together in one shot (combination vaccines). Talk with your health care provider about the risks and benefits of combination vaccines. What tests do I need?  Blood tests  Lipid and cholesterol levels. These may be checked every 5 years starting at age 20.  Hepatitis C test.  Hepatitis B test. Screening  Diabetes screening. This is done by checking your blood sugar (glucose) after you have not eaten for a while (fasting).  Sexually transmitted disease (STD) testing.  BRCA-related cancer screening. This may be done if you have a family history of breast, ovarian, tubal, or peritoneal cancers.  Pelvic exam and Pap test. This may be done every 3 years starting at age 21. Starting at age 30, this may be done every 5 years if you have a Pap test in combination with an HPV test. Talk with your health care provider about your test results, treatment options, and if necessary, the need for more tests.   Follow these instructions at home: Eating and drinking   Eat a diet that includes fresh fruits and vegetables, whole grains, lean protein, and low-fat dairy.  Take vitamin and mineral supplements as recommended by your health care provider.  Do not drink alcohol if: ? Your health care provider tells you not to drink. ? You are  pregnant, may be pregnant, or are planning to become pregnant.  If you drink alcohol: ? Limit how much you have to 0-1 drink a day. ? Be aware of how much alcohol is in your drink. In the [place] one drink equals one 12 oz bottle of beer (355 mL), one 5 oz glass of wine (148 mL), or one 1 oz glass of hard liquor (44 mL). Lifestyle  Take daily care of your teeth and gums.  Stay active. Exercise for at least 30 minutes on 5 or more days each week.  Do not use any products that contain nicotine or tobacco, such as cigarettes, e-cigarettes, and chewing tobacco. If you need help quitting, ask your health care provider.  If you are sexually active, practice safe sex. Use a condom or other form of birth control (contraception) in order to prevent pregnancy and STIs (sexually transmitted infections). If you plan to become pregnant, see your health care provider for a preconception visit. What's next?  Visit your health care provider once a year for a well check visit.  Ask your health care provider how often you should have your eyes and teeth checked.  Stay up to date on all vaccines. This information is not intended to replace advice given to you by your health care provider. Make sure you discuss any questions you have with your health care provider. Document Revised: 12/22/2017 Document Reviewed: 12/22/2017 Elsevier Patient Education  2020 [employer].

## 2019-09-26 NOTE — Progress Notes (Signed)
Subjective:    Lindsay Mcdowell is a 29 y.o. [id number] Caucasian female who presents for a postpartum visit. She is 6 weeks postpartum following a repeat cesarean section, low transverse incision at 38.3 gestational weeks. Anesthesia: spinal. I have fully reviewed the prenatal and intrapartum course. Postpartum course has been WNL. Baby's course has been WNL. Baby is feeding by breast. Bleeding no bleeding. Bowel function is normal. Bladder function is normal. Patient is not sexually active. . Contraception method is condoms. Postpartum depression screening: negative. Score 0.  Last pap 3/16/18and was LSIL, coposcopy 08/18/16. Has not had repeat pap, refused at [facility].   The following portions of the patient's history were reviewed and updated as appropriate: allergies, current medications, past medical history, past surgical history and problem list.  Review of Systems Pertinent items are noted in HPI.   There were no vitals filed for this visit. No LMP recorded.  Objective:   General:  alert, cooperative and no distress   Breasts:  deferred, no complaints  Lungs: clear to auscultation bilaterally  Heart:  regular rate and rhythm  Abdomen: soft, nontender   Vulva: normal  Vagina: normal vagina  Cervix:  closed  Corpus: Well-involuted  Adnexa:  Non-palpable  Rectal Exam: no hemorrhoids        Assessment:   Postpartum exam 6 wks s/p repeat c/section breastfeeding Depression screening Contraception counseling   Plan:  : condoms Follow up in: 6 months for annual/pap smear or earlier if needed  [name], CNM

## 2019-10-25 DIAGNOSIS — Z419 Encounter for procedure for purposes other than remedying health state, unspecified: Secondary | ICD-10-CM | POA: Diagnosis not present

## 2019-11-25 DIAGNOSIS — Z419 Encounter for procedure for purposes other than remedying health state, unspecified: Secondary | ICD-10-CM | POA: Diagnosis not present

## 2019-12-26 DIAGNOSIS — Z419 Encounter for procedure for purposes other than remedying health state, unspecified: Secondary | ICD-10-CM | POA: Diagnosis not present

## 2020-01-25 DIAGNOSIS — Z419 Encounter for procedure for purposes other than remedying health state, unspecified: Secondary | ICD-10-CM | POA: Diagnosis not present

## 2020-02-25 DIAGNOSIS — Z419 Encounter for procedure for purposes other than remedying health state, unspecified: Secondary | ICD-10-CM | POA: Diagnosis not present

## 2020-03-26 DIAGNOSIS — Z419 Encounter for procedure for purposes other than remedying health state, unspecified: Secondary | ICD-10-CM | POA: Diagnosis not present

## 2020-04-01 ENCOUNTER — Encounter: Payer: Medicaid Other | Admitting: Certified Nurse Midwife

## 2020-04-26 DIAGNOSIS — Z419 Encounter for procedure for purposes other than remedying health state, unspecified: Secondary | ICD-10-CM | POA: Diagnosis not present

## 2020-04-29 ENCOUNTER — Other Ambulatory Visit (HOSPITAL_COMMUNITY)
Admission: RE | Admit: 2020-04-29 | Discharge: 2020-04-29 | Disposition: A | Discharge status: Home / Self Care | Payer: Medicaid Other | Source: Ambulatory Visit | Attending: Certified Nurse Midwife | Admitting: Certified Nurse Midwife

## 2020-04-29 ENCOUNTER — Other Ambulatory Visit: Payer: Self-pay

## 2020-04-29 ENCOUNTER — Ambulatory Visit (INDEPENDENT_AMBULATORY_CARE_PROVIDER_SITE_OTHER): Payer: Medicaid Other | Admitting: Certified Nurse Midwife

## 2020-04-29 ENCOUNTER — Encounter: Payer: Self-pay | Admitting: Certified Nurse Midwife

## 2020-04-29 VITALS — BP 123/75 | HR 72 | Ht 64.0 in | Wt 175.5 lb

## 2020-04-29 DIAGNOSIS — Z13220 Encounter for screening for lipoid disorders: Secondary | ICD-10-CM

## 2020-04-29 DIAGNOSIS — Z124 Encounter for screening for malignant neoplasm of cervix: Secondary | ICD-10-CM | POA: Insufficient documentation

## 2020-04-29 DIAGNOSIS — Z1151 Encounter for screening for human papillomavirus (HPV): Secondary | ICD-10-CM | POA: Diagnosis not present

## 2020-04-29 DIAGNOSIS — Z01419 Encounter for gynecological examination (general) (routine) without abnormal findings: Secondary | ICD-10-CM | POA: Diagnosis not present

## 2020-04-29 DIAGNOSIS — Z0000 Encounter for general adult medical examination without abnormal findings: Secondary | ICD-10-CM | POA: Diagnosis not present

## 2020-04-29 DIAGNOSIS — R87610 Atypical squamous cells of undetermined significance on cytologic smear of cervix (ASC-US): Secondary | ICD-10-CM | POA: Insufficient documentation

## 2020-04-29 NOTE — Progress Notes (Signed)
GYNECOLOGY ANNUAL PREVENTATIVE CARE ENCOUNTER NOTE  History:     Lindsay Mcdowell is a 30 y.o. G38P2002 female here for a routine annual gynecologic exam.  Current complaints: none.   Denies abnormal vaginal bleeding, discharge, pelvic pain, problems with intercourse or other gynecologic concerns.     Social Relationship:married Living:with spouse and sons Work: stay at home mom  Exercise:walks Smoke/Alcohol/drug use: denies   Gynecologic History Patient's last menstrual period was 04/21/2020 (exact date). Contraception: condoms Last Pap: 07/09/16. Results were: LSIL,colposocpy 08/18/2016 Last mammogram: n/a   Obstetric History OB History  Gravida Para Term Preterm AB Living  2 2 2     2   SAB IAB Ectopic Multiple Live Births        0 2    # Outcome Date GA Lbr Len/2nd Weight Sex Delivery Anes PTL Lv  2 Term 08/11/19 [redacted]w[redacted]d 02:19 / 04:25 9 lb 3.8 oz (4.19 kg) M CS-LTranv None  LIV  1 Term 01/14/17 [redacted]w[redacted]d  8 lb 10.3 oz (3.92 kg) M CS-LTranv EPI  LIV    Past Medical History:  Diagnosis Date  . Depression   . Postpartum depression   . Vaginal Pap smear, abnormal     Past Surgical History:  Procedure Laterality Date  . CESAREAN SECTION N/A 01/14/2017   Procedure: CESAREAN SECTION;  Surgeon: Herold Harms, MD;  Location: [facility] ORS;  Service: Obstetrics;  Laterality: N/A;  . CESAREAN SECTION N/A 08/11/2019   Procedure: CESAREAN SECTION;  Surgeon: Linzie Collin, MD;  Location: [facility] ORS;  Service: Obstetrics;  Laterality: N/A;  . none      Current Outpatient Medications on File Prior to Visit  Medication Sig Dispense Refill  . Prenatal Vit-Fe Fumarate-FA (PRENATAL VITAMINS) 28-0.8 MG TABS Take by mouth.    Marland Kitchen ibuprofen (ADVIL) 800 MG tablet Take 1 tablet (800 mg total) by mouth every 8 (eight) hours. 30 tablet 0  . oxyCODONE (OXY IR/ROXICODONE) 5 MG immediate release tablet Take 1-2 tablets (5-10 mg total) by mouth every 4 (four) hours as needed for moderate pain or  severe pain. (Patient not taking: Reported on 08/20/2019) 30 tablet 0   No current facility-administered medications on file prior to visit.    Allergies  Allergen Reactions  . Codeine Itching    Pt unsure of reaction, fhx of allergy    Social History:  reports that she has never smoked. She has never used smokeless tobacco. She reports that she does not drink alcohol and does not use drugs.  Family History  Problem Relation Age of Onset  . Hypertension Mother   . Migraines Mother   . Asthma Maternal Grandmother   . Diabetes Maternal Grandmother   . Rashes / Skin problems Maternal Grandmother        eczema    The following portions of the patient's history were reviewed and updated as appropriate: allergies, current medications, past family history, past medical history, past social history, past surgical history and problem list.  Review of Systems Pertinent items noted in HPI and remainder of comprehensive ROS otherwise negative.  Physical Exam:  BP 123/75   Pulse 72   Ht 5\' 4"  (1.626 m)   Wt 175 lb 8 oz (79.6 kg)   LMP 04/21/2020 (Exact Date)   Breastfeeding Yes   BMI 30.12 kg/m  CONSTITUTIONAL: Well-developed, over weight well-nourished female in no acute distress.  HENT:  Normocephalic, atraumatic, External right and left ear normal. Oropharynx is clear and moist EYES: Conjunctivae and  EOM are normal. Pupils are equal, round, and reactive to light. No scleral icterus.  NECK: Normal range of motion, supple, no masses.  Normal thyroid.  SKIN: Skin is warm and dry. No rash noted. Not diaphoretic. No erythema. No pallor. MUSCULOSKELETAL: Normal range of motion. No tenderness.  No cyanosis, clubbing, or edema.  2+ distal pulses. NEUROLOGIC: Alert and oriented to person, place, and time. Normal reflexes, muscle tone coordination.  PSYCHIATRIC: Normal mood and affect. Normal behavior. Normal judgment and thought content. CARDIOVASCULAR: Normal heart rate noted, regular  rhythm RESPIRATORY: Clear to auscultation bilaterally. Effort and breath sounds normal, no problems with respiration noted. BREASTS: Symmetric in size. No masses, tenderness, skin changes, nipple drainage, or lymphadenopathy bilaterally.  ABDOMEN: Soft, no distention noted.  No tenderness, rebound or guarding.  PELVIC: Normal appearing external genitalia and urethral meatus; normal appearing vaginal mucosa and cervix.  No abnormal discharge noted.  Pap smear obtained.  Contact bleeding present Normal uterine size, no other palpable masses, no uterine or adnexal tenderness.  .   Assessment and Plan:    1. Women's annual routine gynecological examination  Pap:Will follow up results of pap smear and manage accordingly. Mammogram :n/a Labs: Lipid profile, declines hep C, declines flu.  Refills:none Referral: Routine preventative health maintenance measures emphasized. Please refer to After Visit Summary for other counseling recommendations.      [name], CNM [facility],  [facility]

## 2020-04-29 NOTE — Patient Instructions (Signed)
Preventive Care 21-31 Years Old, Female Preventive care refers to visits with your health care provider and lifestyle choices that can promote health and wellness. This includes:  A yearly physical exam. This may also be called an annual well check.  Regular dental visits and eye exams.  Immunizations.  Screening for certain conditions.  Healthy lifestyle choices, such as eating a healthy diet, getting regular exercise, not using drugs or products that contain nicotine and tobacco, and limiting alcohol use. What can I expect for my preventive care visit? Physical exam Your health care provider will check your:  Height and weight. This may be used to calculate body mass index (BMI), which tells if you are at a healthy weight.  Heart rate and blood pressure.  Skin for abnormal spots. Counseling Your health care provider may ask you questions about your:  Alcohol, tobacco, and drug use.  Emotional well-being.  Home and relationship well-being.  Sexual activity.  Eating habits.  Work and work environment.  Method of birth control.  Menstrual cycle.  Pregnancy history. What immunizations do I need?  Influenza (flu) vaccine  This is recommended every year. Tetanus, diphtheria, and pertussis (Tdap) vaccine  You may need a Td booster every 10 years. Varicella (chickenpox) vaccine  You may need this if you have not been vaccinated. Human papillomavirus (HPV) vaccine  If recommended by your health care provider, you may need three doses over 6 months. Measles, mumps, and rubella (MMR) vaccine  You may need at least one dose of MMR. You may also need a second dose. Meningococcal conjugate (MenACWY) vaccine  One dose is recommended if you are age 19-21 years and a first-year college student living in a residence hall, or if you have one of several medical conditions. You may also need additional booster doses. Pneumococcal conjugate (PCV13) vaccine  You may need  this if you have certain conditions and were not previously vaccinated. Pneumococcal polysaccharide (PPSV23) vaccine  You may need one or two doses if you smoke cigarettes or if you have certain conditions. Hepatitis A vaccine  You may need this if you have certain conditions or if you travel or work in places where you may be exposed to hepatitis A. Hepatitis B vaccine  You may need this if you have certain conditions or if you travel or work in places where you may be exposed to hepatitis B. Haemophilus influenzae type b (Hib) vaccine  You may need this if you have certain conditions. You may receive vaccines as individual doses or as more than one vaccine together in one shot (combination vaccines). Talk with your health care provider about the risks and benefits of combination vaccines. What tests do I need?  Blood tests  Lipid and cholesterol levels. These may be checked every 5 years starting at age 20.  Hepatitis C test.  Hepatitis B test. Screening  Diabetes screening. This is done by checking your blood sugar (glucose) after you have not eaten for a while (fasting).  Sexually transmitted disease (STD) testing.  BRCA-related cancer screening. This may be done if you have a family history of breast, ovarian, tubal, or peritoneal cancers.  Pelvic exam and Pap test. This may be done every 3 years starting at age 21. Starting at age 30, this may be done every 5 years if you have a Pap test in combination with an HPV test. Talk with your health care provider about your test results, treatment options, and if necessary, the need for more tests.   Follow these instructions at home: Eating and drinking   Eat a diet that includes fresh fruits and vegetables, whole grains, lean protein, and low-fat dairy.  Take vitamin and mineral supplements as recommended by your health care provider.  Do not drink alcohol if: ? Your health care provider tells you not to drink. ? You are  pregnant, may be pregnant, or are planning to become pregnant.  If you drink alcohol: ? Limit how much you have to 0-1 drink a day. ? Be aware of how much alcohol is in your drink. In the [place] one drink equals one 12 oz bottle of beer (355 mL), one 5 oz glass of wine (148 mL), or one 1 oz glass of hard liquor (44 mL). Lifestyle  Take daily care of your teeth and gums.  Stay active. Exercise for at least 30 minutes on 5 or more days each week.  Do not use any products that contain nicotine or tobacco, such as cigarettes, e-cigarettes, and chewing tobacco. If you need help quitting, ask your health care provider.  If you are sexually active, practice safe sex. Use a condom or other form of birth control (contraception) in order to prevent pregnancy and STIs (sexually transmitted infections). If you plan to become pregnant, see your health care provider for a preconception visit. What's next?  Visit your health care provider once a year for a well check visit.  Ask your health care provider how often you should have your eyes and teeth checked.  Stay up to date on all vaccines. This information is not intended to replace advice given to you by your health care provider. Make sure you discuss any questions you have with your health care provider. Document Revised: 12/22/2017 Document Reviewed: 12/22/2017 Elsevier Patient Education  2020 [employer].

## 2020-04-30 LAB — LIPID PANEL
Chol/HDL Ratio: 2.7 ratio (ref 0.0–4.4)
Cholesterol, Total: 197 mg/dL (ref 100–199)
HDL: 73 mg/dL (ref >39)
LDL Chol Calc (NIH): 109 mg/dL — ABNORMAL HIGH (ref 0–99)
Triglycerides: 85 mg/dL (ref 0–149)
VLDL Cholesterol Cal: 15 mg/dL (ref 5–40)

## 2020-05-07 LAB — CYTOLOGY - PAP
Comment: NEGATIVE
Diagnosis: UNDETERMINED — AB
High risk HPV: NEGATIVE

## 2020-05-08 ENCOUNTER — Telehealth: Payer: Self-pay

## 2020-05-08 NOTE — Telephone Encounter (Signed)
mychart message sent to patient

## 2020-05-08 NOTE — Progress Notes (Signed)
Yes, repeat Pap in one (1) year. Please inform patient. Thanks, JML

## 2020-05-27 DIAGNOSIS — Z419 Encounter for procedure for purposes other than remedying health state, unspecified: Secondary | ICD-10-CM | POA: Diagnosis not present

## 2020-06-24 DIAGNOSIS — Z419 Encounter for procedure for purposes other than remedying health state, unspecified: Secondary | ICD-10-CM | POA: Diagnosis not present

## 2020-07-08 ENCOUNTER — Ambulatory Visit (INDEPENDENT_AMBULATORY_CARE_PROVIDER_SITE_OTHER): Payer: Medicaid Other | Admitting: Certified Nurse Midwife

## 2020-07-08 ENCOUNTER — Encounter: Payer: Self-pay | Admitting: Certified Nurse Midwife

## 2020-07-08 ENCOUNTER — Telehealth: Payer: Self-pay | Admitting: Certified Nurse Midwife

## 2020-07-08 ENCOUNTER — Other Ambulatory Visit: Payer: Self-pay

## 2020-07-08 VITALS — BP 126/76 | HR 85 | Ht 64.0 in | Wt 179.5 lb

## 2020-07-08 DIAGNOSIS — Z113 Encounter for screening for infections with a predominantly sexual mode of transmission: Secondary | ICD-10-CM | POA: Diagnosis not present

## 2020-07-08 DIAGNOSIS — N926 Irregular menstruation, unspecified: Secondary | ICD-10-CM

## 2020-07-08 DIAGNOSIS — R638 Other symptoms and signs concerning food and fluid intake: Secondary | ICD-10-CM | POA: Diagnosis not present

## 2020-07-08 DIAGNOSIS — N925 Other specified irregular menstruation: Secondary | ICD-10-CM | POA: Diagnosis not present

## 2020-07-08 DIAGNOSIS — Z0283 Encounter for blood-alcohol and blood-drug test: Secondary | ICD-10-CM | POA: Diagnosis not present

## 2020-07-08 DIAGNOSIS — Z3201 Encounter for pregnancy test, result positive: Secondary | ICD-10-CM | POA: Diagnosis not present

## 2020-07-08 LAB — POCT URINE PREGNANCY: Preg Test, Ur: POSITIVE — AB

## 2020-07-08 NOTE — Progress Notes (Signed)
Subjective:    Lindsay Mcdowell is a 30 y.o. female who presents for evaluation of amenorrhea. She believes she could be pregnant. Pregnancy is desired. Sexual Activity: single partner, contraception: none. Current symptoms also include: breast tenderness, fatigue and nausea. Last period was normal.   Patient's last menstrual period was 04/21/2020. The following portions of the patient's history were reviewed and updated as appropriate: allergies, current medications, past family history, past medical history, past social history, past surgical history and problem list.  Review of Systems Pertinent items are noted in HPI.     Objective:    BP 126/76   Pulse 85   Ht 5\' 4"  (1.626 m)   Wt 179 lb 8 oz (81.4 kg)   LMP 04/21/2020   BMI 30.81 kg/m  General: alert, cooperative, appears stated age and no acute distress    Lab Review Urine HCG: positive    Assessment:    Absence of menstruation.     Plan:   Positive: EDC10/06/2020: . Briefly discussed pre-natal care options. MD or midwifery care reviewed. Plans to see Midwives. Encouraged well-balanced diet, plenty of rest when needed, pre-natal vitamins daily and walking for exercise. Discussed self-help for nausea, avoiding OTC medications until consulting provider or pharmacist, other than Tylenol as needed, minimal caffeine (1-2 cups daily) and avoiding alcohol. She will schedule her ultrasound for dating and her NOB physical with 1 wk.  Feel free to call with any questions.   03/28/2021, CNM

## 2020-07-08 NOTE — Progress Notes (Signed)
PT is present today for confirmation of pregnancy. Pt LMP 04/21/2020. UPT done today results were positive. Pregnancy information given to pt via mychart. Pt advised to have AVS printed during check out for print out of information. Pt stated that she is doing well no complaints.

## 2020-07-08 NOTE — Patient Instructions (Signed)
WHAT OB PATIENTS CAN EXPECT   Confirmation of pregnancy and ultrasound ordered if medically indicated-[redacted] weeks gestation  New OB (NOB) intake with nurse and New OB (NOB) labs- [redacted] weeks gestation  New OB (NOB) physical examination with provider- 11/[redacted] weeks gestation  Flu vaccine-[redacted] weeks gestation  Anatomy scan-[redacted] weeks gestation  Glucose tolerance test, blood work to test for anemia, T-dap vaccine-[redacted] weeks gestation  Vaginal swabs/cultures-STD/Group B strep-[redacted] weeks gestation  Appointments every 4 weeks until 28 weeks  Every 2 weeks from 28 weeks until 36 weeks  Weekly visits from 36 weeks until delivery  Commonly Asked Questions During Pregnancy  Cats: A parasite can be excreted in cat feces.  To avoid exposure you need to have another person empty the little box.  If you must empty the litter box you will need to wear gloves.  Wash your hands after handling your cat.  This parasite can also be found in raw or undercooked meat so this should also be avoided.  Colds, Sore Throats, Flu: Please check your medication sheet to see what you can take for symptoms.  If your symptoms are unrelieved by these medications please call the office.  Dental Work: Most any dental work Investment banker, corporate recommends is permitted.  X-rays should only be taken during the first trimester if absolutely necessary.  Your abdomen should be shielded with a lead apron during all x-rays.  Please notify your provider prior to receiving any x-rays.  Novocaine is fine; gas is not recommended.  If your dentist requires a note from [place] prior to dental work please call the office and we will provide one for you.  Exercise: Exercise is an important part of staying healthy during your pregnancy.  You may continue most exercises you were accustomed to prior to pregnancy.  Later in your pregnancy you will most likely notice you have difficulty with activities requiring balance like riding a bicycle.  It is important that you  listen to your body and avoid activities that put you at a higher risk of falling.  Adequate rest and staying well hydrated are a must!  If you have questions about the safety of specific activities ask your provider.    Exposure to Children with illness: Try to avoid obvious exposure; report any symptoms to [place] when noted,  If you have chicken pos, red measles or mumps, you should be immune to these diseases.   Please do not take any vaccines while pregnant unless you have checked with your OB provider.  Fetal Movement: After 28 weeks we recommend you do "kick counts" twice daily.  Lie or sit down in a calm quiet environment and count your baby movements "kicks".  You should feel your baby at least 10 times per hour.  If you have not felt 10 kicks within the first hour get up, walk around and have something sweet to eat or drink then repeat for an additional hour.  If count remains less than 10 per hour notify your provider.  Fumigating: Follow your pest control agent's advice as to how long to stay out of your home.  Ventilate the area well before re-entering.  Hemorrhoids:   Most over-the-counter preparations can be used during pregnancy.  Check your medication to see what is safe to use.  It is important to use a stool softener or fiber in your diet and to drink lots of liquids.  If hemorrhoids seem to be getting worse please call the office.   Hot Tubs:  Hot tubs Jacuzzis and saunas are not recommended while pregnant.  These increase your internal body temperature and should be avoided.  Intercourse:  Sexual intercourse is safe during pregnancy as long as you are comfortable, unless otherwise advised by your provider.  Spotting may occur after intercourse; report any bright red bleeding that is heavier than spotting.  Labor:  If you know that you are in labor, please go to the hospital.  If you are unsure, please call the office and let us help you decide what to do.  Lifting, straining, etc:  If  your job requires heavy lifting or straining please check with your provider for any limitations.  Generally, you should not lift items heavier than that you can lift simply with your hands and arms (no back muscles)  Painting:  Paint fumes do not harm your pregnancy, but may make you ill and should be avoided if possible.  Latex or water based paints have less odor than oils.  Use adequate ventilation while painting.  Permanents & Hair Color:  Chemicals in hair dyes are not recommended as they cause increase hair dryness which can increase hair loss during pregnancy.  " Highlighting" and permanents are allowed.  Dye may be absorbed differently and permanents may not hold as well during pregnancy.  Sunbathing:  Use a sunscreen, as skin burns easily during pregnancy.  Drink plenty of fluids; avoid over heating.  Tanning Beds:  Because their possible side effects are still unknown, tanning beds are not recommended.  Ultrasound Scans:  Routine ultrasounds are performed at approximately 20 weeks.  You will be able to see your baby's general anatomy an if you would like to know the gender this can usually be determined as well.  If it is questionable when you conceived you may also receive an ultrasound early in your pregnancy for dating purposes.  Otherwise ultrasound exams are not routinely performed unless there is a medical necessity.  Although you can request a scan we ask that you pay for it when conducted because insurance does not cover " patient request" scans.  Work: If your pregnancy proceeds without complications you may work until your due date, unless your physician or employer advises otherwise.  Round Ligament Pain/Pelvic Discomfort:  Sharp, shooting pains not associated with bleeding are fairly common, usually occurring in the second trimester of pregnancy.  They tend to be worse when standing up or when you remain standing for long periods of time.  These are the result of pressure of  certain pelvic ligaments called "round ligaments".  Rest, Tylenol and heat seem to be the most effective relief.  As the womb and fetus grow, they rise out of the pelvis and the discomfort improves.  Please notify the office if your pain seems different than that described.  It may represent a more serious condition.  Common Medications Safe in Pregnancy  Acne:      Constipation:  Benzoyl Peroxide     Colace  Clindamycin      Dulcolax Suppository  Topica Erythromycin     Fibercon  Salicylic Acid      Metamucil         Miralax AVOID:        Senakot   Accutane    Cough:  Retin-A       Cough Drops  Tetracycline      Phenergan w/ Codeine if Rx  Minocycline      Robitussin (Plain & DM)  Antibiotics:  Crabs/Lice:  Ceclor       RID  Cephalosporins    AVOID:  E-Mycins      Kwell  Keflex  Macrobid/Macrodantin   Diarrhea:  Penicillin      Kao-Pectate  Zithromax      Imodium AD         PUSH FLUIDS AVOID:       Cipro     Fever:  Tetracycline      Tylenol (Regular or Extra  Minocycline       Strength)  Levaquin      Extra Strength-Do not          Exceed 8 tabs/24 hrs Caffeine:        <298m/day (equiv. To 1 cup of coffee or  approx. 3 12 oz sodas)         Gas: Cold/Hayfever:       Gas-X  Benadryl      Mylicon  Claritin       Phazyme  **Claritin-D        Chlor-Trimeton    Headaches:  Dimetapp      ASA-Free Excedrin  Drixoral-Non-Drowsy     Cold Compress  Mucinex (Guaifenasin)     Tylenol (Regular or Extra  Sudafed/Sudafed-12 Hour     Strength)  **Sudafed PE Pseudoephedrine   Tylenol Cold & Sinus     Vicks Vapor Rub  Zyrtec  **AVOID if Problems With Blood Pressure         Heartburn: Avoid lying down for at least 1 hour after meals  Aciphex      Maalox     Rash:  Milk of Magnesia     Benadryl    Mylanta       1% Hydrocortisone Cream  Pepcid  Pepcid Complete   Sleep Aids:  Prevacid      Ambien   Prilosec       Benadryl  Rolaids       Chamomile Tea  Tums (Limit  4/day)     Unisom         Tylenol PM         Warm milk-add vanilla or  Hemorrhoids:       Sugar for taste  Anusol/Anusol H.C.  (RX: Analapram 2.5%)  Sugar Substitutes:  Hydrocortisone OTC     Ok in moderation  Preparation H      Tucks        Vaseline lotion applied to tissue with wiping    Herpes:     Throat:  Acyclovir      Oragel  Famvir  Valtrex     Vaccines:         Flu Shot Leg Cramps:       *Gardasil  Benadryl      Hepatitis A         Hepatitis B Nasal Spray:       Pneumovax  Saline Nasal Spray     Polio Booster         Tetanus Nausea:       Tuberculosis test or PPD  Vitamin B6 25 mg TID   AVOID:    Dramamine      *Gardasil  Emetrol       Live Poliovirus  Ginger Root 250 mg QID    MMR (measles, mumps &  High Complex Carbs @ Bedtime    rebella)  Sea Bands-Accupressure    Varicella (Chickenpox)  Unisom 1/2 tab TID     *No known complications  If received before Pain:         Known pregnancy;   Darvocet       Resume series after  Lortab        Delivery  Percocet    Yeast:   Tramadol      Femstat  Tylenol 3      Gyne-lotrimin  Ultram       Monistat  Vicodin           MISC:         All Sunscreens           Hair Coloring/highlights          Insect Repellant's          (Including DEET)         Mystic Tans

## 2020-07-09 LAB — HCV INTERPRETATION

## 2020-07-09 LAB — CBC
Hematocrit: 37 % (ref 34.0–46.6)
Hemoglobin: 12 g/dL (ref 11.1–15.9)
MCH: 28 pg (ref 26.6–33.0)
MCHC: 32.4 g/dL (ref 31.5–35.7)
MCV: 86 fL (ref 79–97)
Platelets: 350 10*3/uL (ref 150–450)
RBC: 4.29 x10E6/uL (ref 3.77–5.28)
RDW: 14.4 % (ref 11.7–15.4)
WBC: 5.2 10*3/uL (ref 3.4–10.8)

## 2020-07-09 LAB — HEMOGLOBIN A1C
Est. average glucose Bld gHb Est-mCnc: 111 mg/dL
Hgb A1c MFr Bld: 5.5 % (ref 4.8–5.6)

## 2020-07-09 LAB — ABO AND RH: Rh Factor: POSITIVE

## 2020-07-09 LAB — VIRAL HEPATITIS HBV, HCV
HCV Ab: <0.1 s/co ratio (ref 0.0–0.9)
Hep B Core Total Ab: NEGATIVE
Hep B Surface Ab, Qual: REACTIVE
Hepatitis B Surface Ag: NEGATIVE

## 2020-07-09 LAB — RPR: RPR Ser Ql: NONREACTIVE

## 2020-07-09 LAB — HIV ANTIBODY (ROUTINE TESTING W REFLEX): HIV Screen 4th Generation wRfx: NONREACTIVE

## 2020-07-09 LAB — RUBELLA SCREEN: Rubella Antibodies, IGG: <0.9 index — ABNORMAL LOW (ref >0.99)

## 2020-07-09 LAB — VARICELLA ZOSTER ANTIBODY, IGG: Varicella zoster IgG: 1180 index (ref >165)

## 2020-07-09 LAB — ANTIBODY SCREEN: Antibody Screen: NEGATIVE

## 2020-07-09 LAB — TSH: TSH: 1.66 u[IU]/mL (ref 0.450–4.500)

## 2020-07-10 ENCOUNTER — Telehealth: Payer: Self-pay | Admitting: Certified Nurse Midwife

## 2020-07-10 NOTE — Telephone Encounter (Signed)
error 

## 2020-07-10 NOTE — Telephone Encounter (Signed)
I scheudled [place] for pt - scheudling states taht order is STAT and is wanting to see if it needs to be STAT or Routine- she has been scheudled so if Routine- order needs to be changed.

## 2020-07-11 ENCOUNTER — Other Ambulatory Visit: Payer: Self-pay

## 2020-07-11 DIAGNOSIS — N926 Irregular menstruation, unspecified: Secondary | ICD-10-CM

## 2020-07-11 NOTE — Telephone Encounter (Signed)
Not a stat scan.   New order placed as routine.

## 2020-07-14 ENCOUNTER — Telehealth: Payer: Self-pay | Admitting: Certified Nurse Midwife

## 2020-07-14 NOTE — Telephone Encounter (Signed)
Ob 12 weeks- brown discharge x 1. Size of a quarter. IC - 2 days ago.   +cramp and back pain-Pt rested and it's much better now  No issues with urination or bowel movements.  + for eating and drinking  Dating scan schedule for tomorrow.  Pt advised to monitor for now. IF bleeding like a period or cramps not relieved with tylenol to contact office or go to ED. Pt voiced understanding.

## 2020-07-14 NOTE — Telephone Encounter (Signed)
New message    Patient is 12 weeks today. C/o bleeding that started today.

## 2020-07-15 ENCOUNTER — Ambulatory Visit
Admission: RE | Admit: 2020-07-15 | Discharge: 2020-07-15 | Disposition: A | Discharge status: Home / Self Care | Payer: Medicaid Other | Source: Ambulatory Visit | Attending: Certified Nurse Midwife | Admitting: Certified Nurse Midwife

## 2020-07-15 ENCOUNTER — Other Ambulatory Visit: Payer: Self-pay

## 2020-07-15 ENCOUNTER — Encounter: Payer: Medicaid Other | Admitting: Certified Nurse Midwife

## 2020-07-15 DIAGNOSIS — Z3491 Encounter for supervision of normal pregnancy, unspecified, first trimester: Secondary | ICD-10-CM | POA: Insufficient documentation

## 2020-07-15 DIAGNOSIS — O26891 Other specified pregnancy related conditions, first trimester: Secondary | ICD-10-CM | POA: Diagnosis not present

## 2020-07-15 DIAGNOSIS — Z3A01 Less than 8 weeks gestation of pregnancy: Secondary | ICD-10-CM | POA: Diagnosis not present

## 2020-07-15 DIAGNOSIS — N926 Irregular menstruation, unspecified: Secondary | ICD-10-CM | POA: Diagnosis not present

## 2020-07-16 ENCOUNTER — Other Ambulatory Visit: Payer: Self-pay

## 2020-07-16 ENCOUNTER — Telehealth: Payer: Self-pay

## 2020-07-16 ENCOUNTER — Other Ambulatory Visit: Payer: Medicaid Other

## 2020-07-16 DIAGNOSIS — N926 Irregular menstruation, unspecified: Secondary | ICD-10-CM

## 2020-07-16 NOTE — Telephone Encounter (Signed)
Per AT - Pt needs beta today and Friday. Pt aware appt scheduled.   AT to be on the lookout for the scan. She will contact pt for next steps.   NOB cx. Pt to contact office for severe cramps or soaking a pad every 30 minutes.   Pt voiced understanding.

## 2020-07-17 ENCOUNTER — Telehealth: Payer: Self-pay | Admitting: Certified Nurse Midwife

## 2020-07-17 ENCOUNTER — Other Ambulatory Visit: Payer: Self-pay | Admitting: Certified Nurse Midwife

## 2020-07-17 DIAGNOSIS — O209 Hemorrhage in early pregnancy, unspecified: Secondary | ICD-10-CM

## 2020-07-17 LAB — BETA HCG QUANT (REF LAB): hCG Quant: 4449 m[IU]/mL

## 2020-07-17 NOTE — Telephone Encounter (Signed)
Called to discussed u/s results and lab results. Left message.   [name], CNM

## 2020-07-18 ENCOUNTER — Encounter: Payer: Medicaid Other | Admitting: Certified Nurse Midwife

## 2020-07-18 ENCOUNTER — Other Ambulatory Visit: Payer: Medicaid Other

## 2020-07-18 ENCOUNTER — Other Ambulatory Visit: Payer: Self-pay

## 2020-07-18 ENCOUNTER — Other Ambulatory Visit: Payer: Self-pay | Admitting: Certified Nurse Midwife

## 2020-07-18 DIAGNOSIS — Z3491 Encounter for supervision of normal pregnancy, unspecified, first trimester: Secondary | ICD-10-CM

## 2020-07-18 DIAGNOSIS — N926 Irregular menstruation, unspecified: Secondary | ICD-10-CM

## 2020-07-19 LAB — BETA HCG QUANT (REF LAB): hCG Quant: 3355 m[IU]/mL

## 2020-07-21 ENCOUNTER — Telehealth: Payer: Self-pay | Admitting: Certified Nurse Midwife

## 2020-07-21 ENCOUNTER — Other Ambulatory Visit: Payer: Self-pay | Admitting: Certified Nurse Midwife

## 2020-07-21 DIAGNOSIS — O209 Hemorrhage in early pregnancy, unspecified: Secondary | ICD-10-CM

## 2020-07-21 DIAGNOSIS — O3680X0 Pregnancy with inconclusive fetal viability, not applicable or unspecified: Secondary | ICD-10-CM

## 2020-07-21 NOTE — Telephone Encounter (Signed)
Patient is calling for results of her Beta.

## 2020-07-21 NOTE — Telephone Encounter (Signed)
Ellenora called to get results.

## 2020-07-24 ENCOUNTER — Telehealth: Payer: Self-pay | Admitting: Certified Nurse Midwife

## 2020-07-24 DIAGNOSIS — O039 Complete or unspecified spontaneous abortion without complication: Secondary | ICD-10-CM

## 2020-07-24 NOTE — Telephone Encounter (Signed)
Pt state that she had heavy bleeding and passed tissue last night, the bleeding has slowed today. Reassurance given. Red flag symptoms reviewed. Pt to have Hcg level completed on Monday. Orders placed.  [name], CNM

## 2020-07-25 DIAGNOSIS — Z419 Encounter for procedure for purposes other than remedying health state, unspecified: Secondary | ICD-10-CM | POA: Diagnosis not present

## 2020-07-29 ENCOUNTER — Other Ambulatory Visit: Payer: Medicaid Other

## 2020-07-29 ENCOUNTER — Other Ambulatory Visit: Payer: Self-pay

## 2020-07-29 DIAGNOSIS — O039 Complete or unspecified spontaneous abortion without complication: Secondary | ICD-10-CM

## 2020-07-30 ENCOUNTER — Other Ambulatory Visit: Payer: Self-pay | Admitting: Certified Nurse Midwife

## 2020-07-30 DIAGNOSIS — O039 Complete or unspecified spontaneous abortion without complication: Secondary | ICD-10-CM

## 2020-07-30 LAB — BETA HCG QUANT (REF LAB): hCG Quant: 50 m[IU]/mL

## 2020-08-01 ENCOUNTER — Other Ambulatory Visit: Payer: Self-pay

## 2020-08-01 ENCOUNTER — Other Ambulatory Visit: Payer: Medicaid Other

## 2020-08-01 DIAGNOSIS — O039 Complete or unspecified spontaneous abortion without complication: Secondary | ICD-10-CM

## 2020-08-02 ENCOUNTER — Other Ambulatory Visit: Payer: Self-pay | Admitting: Certified Nurse Midwife

## 2020-08-02 DIAGNOSIS — O039 Complete or unspecified spontaneous abortion without complication: Secondary | ICD-10-CM

## 2020-08-02 LAB — BETA HCG QUANT (REF LAB): hCG Quant: 22 m[IU]/mL

## 2020-08-14 ENCOUNTER — Ambulatory Visit: Admission: RE | Admit: 2020-08-14 | Payer: Medicaid Other | Source: Ambulatory Visit

## 2020-08-24 DIAGNOSIS — Z419 Encounter for procedure for purposes other than remedying health state, unspecified: Secondary | ICD-10-CM | POA: Diagnosis not present

## 2020-09-24 DIAGNOSIS — Z419 Encounter for procedure for purposes other than remedying health state, unspecified: Secondary | ICD-10-CM | POA: Diagnosis not present

## 2020-10-24 DIAGNOSIS — Z419 Encounter for procedure for purposes other than remedying health state, unspecified: Secondary | ICD-10-CM | POA: Diagnosis not present

## 2020-11-24 DIAGNOSIS — Z419 Encounter for procedure for purposes other than remedying health state, unspecified: Secondary | ICD-10-CM | POA: Diagnosis not present

## 2020-12-25 DIAGNOSIS — Z419 Encounter for procedure for purposes other than remedying health state, unspecified: Secondary | ICD-10-CM | POA: Diagnosis not present

## 2021-01-24 DIAGNOSIS — Z419 Encounter for procedure for purposes other than remedying health state, unspecified: Secondary | ICD-10-CM | POA: Diagnosis not present

## 2021-02-24 DIAGNOSIS — Z419 Encounter for procedure for purposes other than remedying health state, unspecified: Secondary | ICD-10-CM | POA: Diagnosis not present

## 2021-03-26 DIAGNOSIS — Z419 Encounter for procedure for purposes other than remedying health state, unspecified: Secondary | ICD-10-CM | POA: Diagnosis not present

## 2021-04-26 DIAGNOSIS — Z419 Encounter for procedure for purposes other than remedying health state, unspecified: Secondary | ICD-10-CM | POA: Diagnosis not present

## 2021-05-05 ENCOUNTER — Encounter: Payer: Medicaid Other | Admitting: Certified Nurse Midwife

## 2021-05-27 DIAGNOSIS — Z419 Encounter for procedure for purposes other than remedying health state, unspecified: Secondary | ICD-10-CM | POA: Diagnosis not present

## 2021-06-24 DIAGNOSIS — Z419 Encounter for procedure for purposes other than remedying health state, unspecified: Secondary | ICD-10-CM | POA: Diagnosis not present

## 2021-07-25 DIAGNOSIS — Z419 Encounter for procedure for purposes other than remedying health state, unspecified: Secondary | ICD-10-CM | POA: Diagnosis not present

## 2021-08-24 DIAGNOSIS — Z419 Encounter for procedure for purposes other than remedying health state, unspecified: Secondary | ICD-10-CM | POA: Diagnosis not present

## 2021-09-24 DIAGNOSIS — Z419 Encounter for procedure for purposes other than remedying health state, unspecified: Secondary | ICD-10-CM | POA: Diagnosis not present

## 2021-10-24 DIAGNOSIS — Z419 Encounter for procedure for purposes other than remedying health state, unspecified: Secondary | ICD-10-CM | POA: Diagnosis not present

## 2021-11-24 DIAGNOSIS — Z419 Encounter for procedure for purposes other than remedying health state, unspecified: Secondary | ICD-10-CM | POA: Diagnosis not present

## 2021-12-25 DIAGNOSIS — Z419 Encounter for procedure for purposes other than remedying health state, unspecified: Secondary | ICD-10-CM | POA: Diagnosis not present

## 2022-01-24 DIAGNOSIS — Z419 Encounter for procedure for purposes other than remedying health state, unspecified: Secondary | ICD-10-CM | POA: Diagnosis not present

## 2022-02-05 ENCOUNTER — Encounter: Payer: Self-pay | Admitting: Certified Nurse Midwife

## 2022-02-11 IMAGING — US US OB COMP LESS 14 WK
1 series · 13 of 28 positions shown · non-contrast
Comparison: Ultrasound 04/10/2019

CLINICAL DATA: Dating, discharge and low back pain which began 1
day prior, positive UPT

EXAM:
OBSTETRIC <14 WK ULTRASOUND
TECHNIQUE: Transabdominal ultrasound was performed for evaluation of the
gestation as well as the maternal uterus and adnexal regions.

[Series 1: us ob comp less 14 wks · 74 acquisitions, 13 frames shown]
[im 3/74]
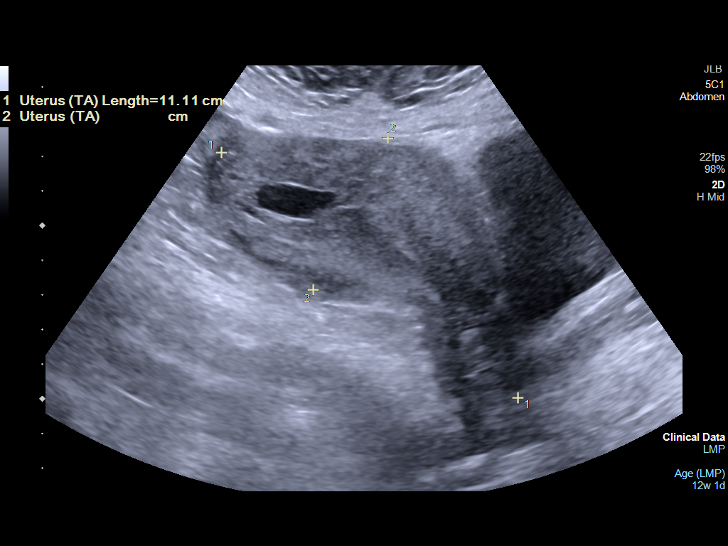
[im 9/74]
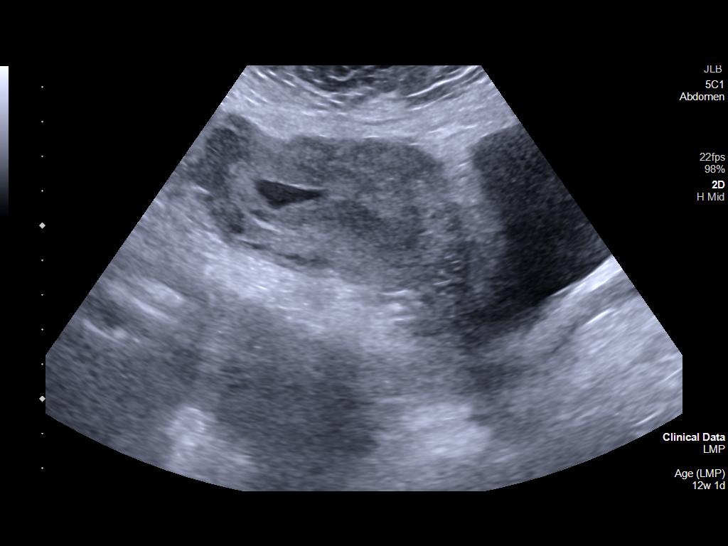
[im 14/74]
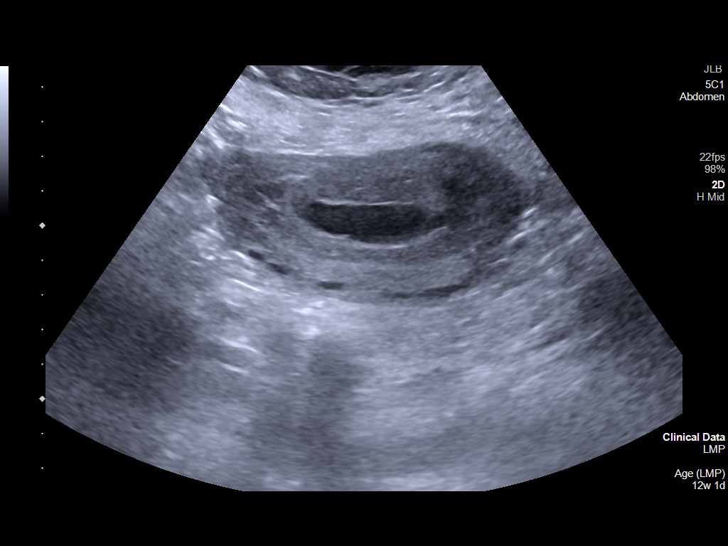
[im 19/74]
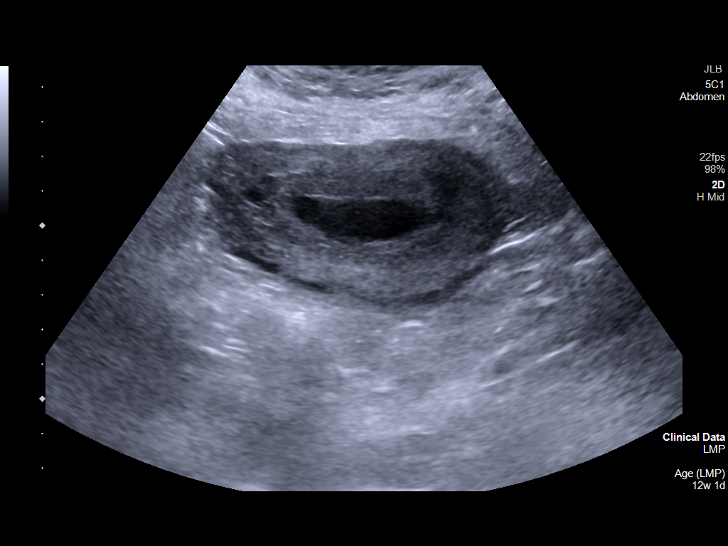
[im 25/74]
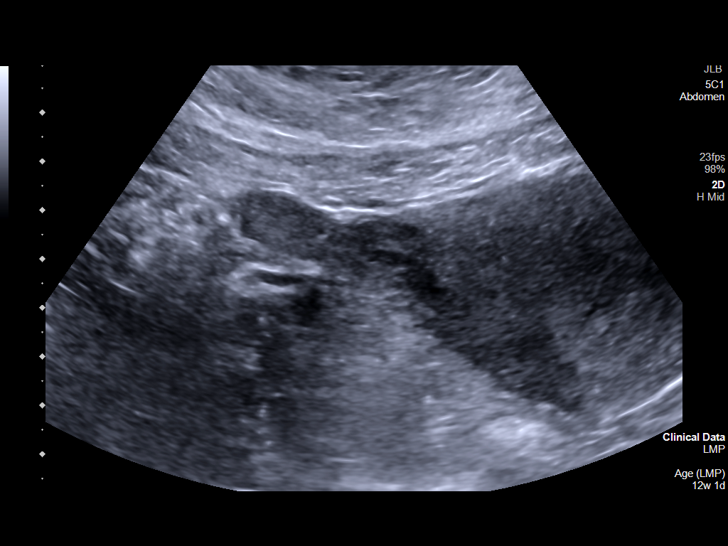
[im 30/74]
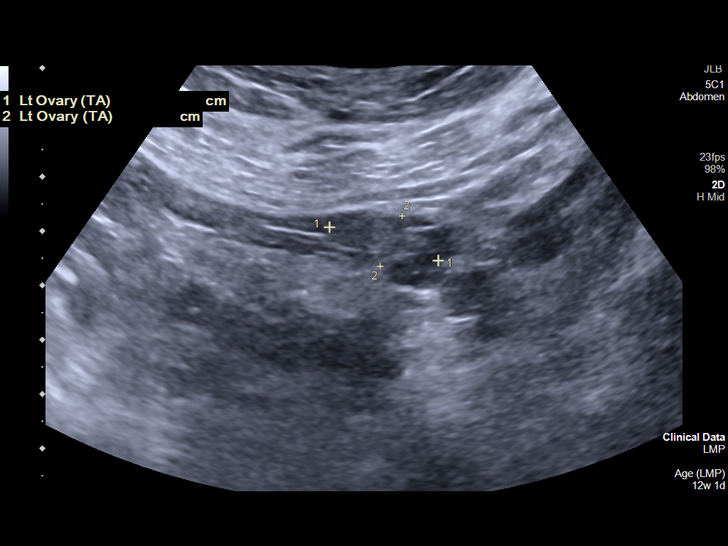
[im 38/74]
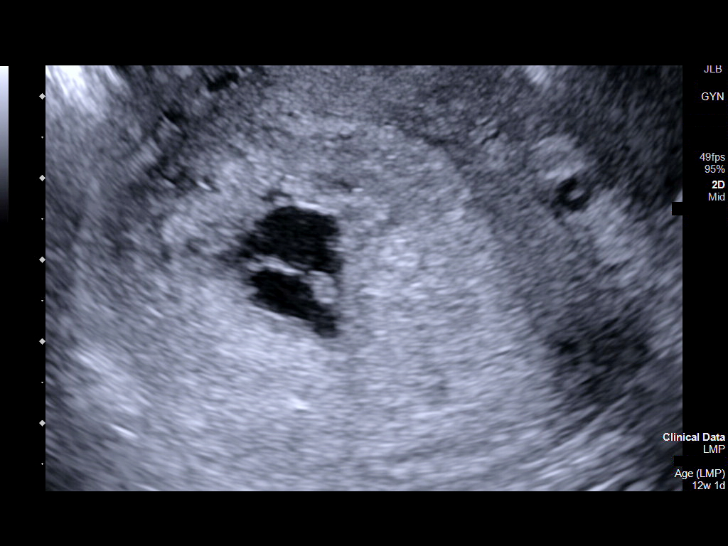
[im 44/74]
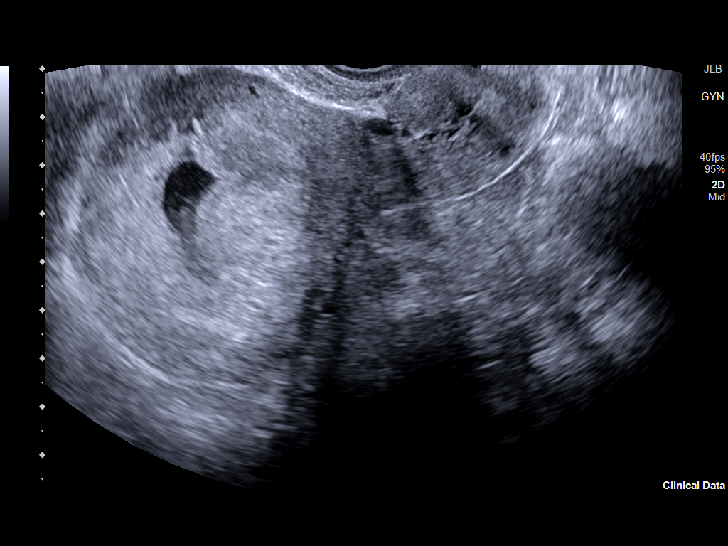
[im 49/74]
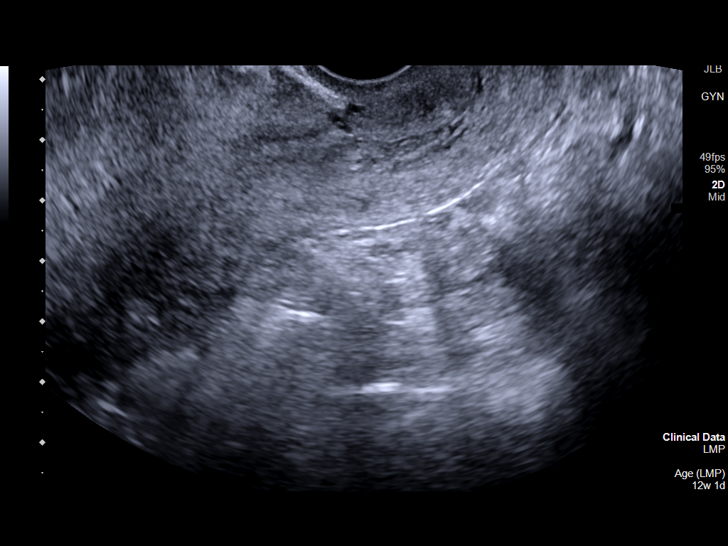
[im 55/74]
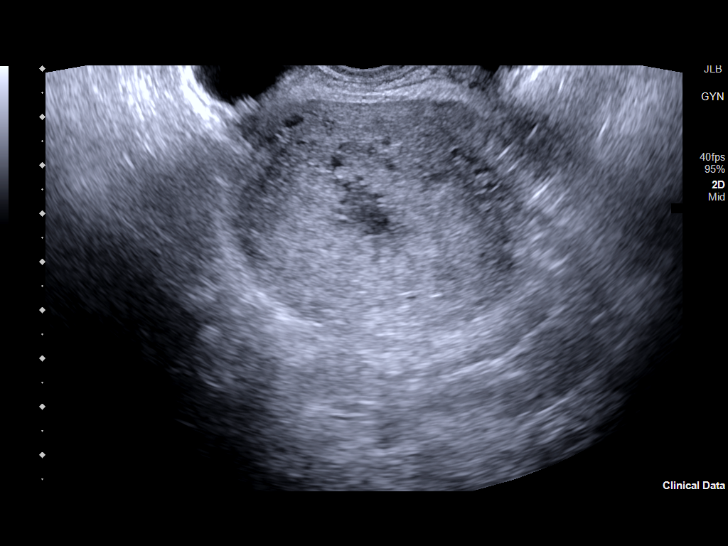
[im 60/74]
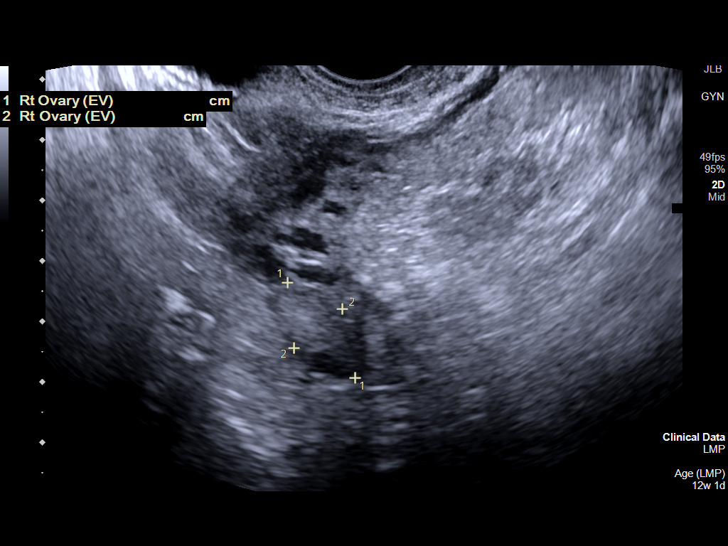
[im 65/74]
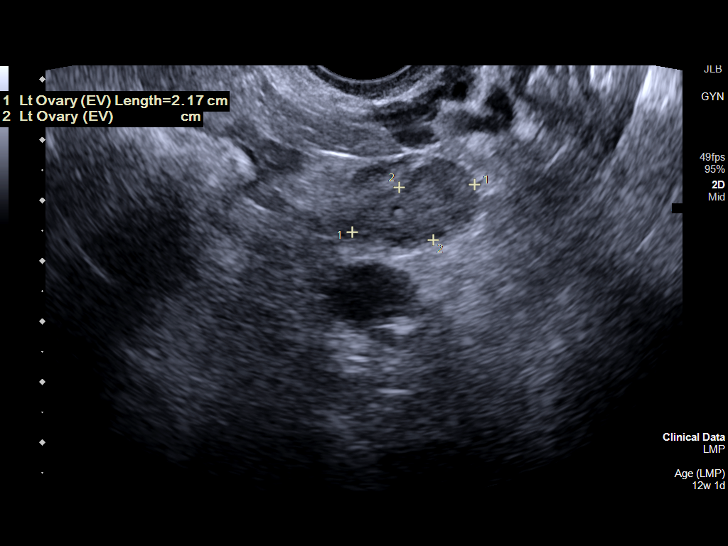
[im 71/74]
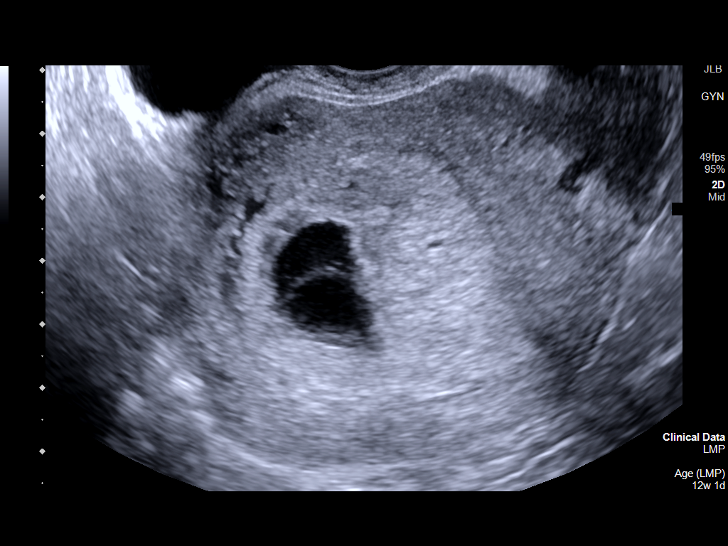

[13 of 28 positions shown; findings below may reference images not displayed]

FINDINGS: Intrauterine gestational sac: Single

Yolk sac:  Not Visualized.

Embryo:  Visualized.

Cardiac Activity: Not Visualized.

CRL:   4.8 mm   6 w 1 d                  US EDC: 03/09/2021

Subchorionic hemorrhage:  None visualized.

Maternal uterus/adnexae: Anteverted maternal uterus. Few nabothian
cyst towards the cervix. Normal appearance of the ovaries. No pelvic
free fluid.
IMPRESSION: Single intrauterine gestation at an estimated gestational age of 6
weeks, 1 day. Cardiac activity is not yet visualized. Absence of a
yolk sac is nonspecific though can portend nonviability. Findings
are suspicious but not yet definitive for failed pregnancy.
Recommend follow-up US in 10-14 days for definitive diagnosis. This
recommendation follows [facility] consensus guidelines: Diagnostic Criteria
for Nonviable Pregnancy Early in the First Trimester. N Engl J Med
5484; [DATE].

These results will be called to the ordering clinician or
representative by the Radiologist Assistant, and communication
documented in the PACS or [REDACTED].

## 2022-02-24 DIAGNOSIS — Z419 Encounter for procedure for purposes other than remedying health state, unspecified: Secondary | ICD-10-CM | POA: Diagnosis not present

## 2022-03-26 DIAGNOSIS — Z419 Encounter for procedure for purposes other than remedying health state, unspecified: Secondary | ICD-10-CM | POA: Diagnosis not present

## 2022-04-26 DIAGNOSIS — Z419 Encounter for procedure for purposes other than remedying health state, unspecified: Secondary | ICD-10-CM | POA: Diagnosis not present

## 2022-05-27 DIAGNOSIS — Z419 Encounter for procedure for purposes other than remedying health state, unspecified: Secondary | ICD-10-CM | POA: Diagnosis not present

## 2022-05-31 DIAGNOSIS — K029 Dental caries, unspecified: Secondary | ICD-10-CM | POA: Diagnosis not present

## 2022-06-25 DIAGNOSIS — Z419 Encounter for procedure for purposes other than remedying health state, unspecified: Secondary | ICD-10-CM | POA: Diagnosis not present

## 2022-07-26 DIAGNOSIS — Z419 Encounter for procedure for purposes other than remedying health state, unspecified: Secondary | ICD-10-CM | POA: Diagnosis not present

## 2022-08-25 DIAGNOSIS — Z419 Encounter for procedure for purposes other than remedying health state, unspecified: Secondary | ICD-10-CM | POA: Diagnosis not present

## 2022-09-10 DIAGNOSIS — K1121 Acute sialoadenitis: Secondary | ICD-10-CM | POA: Diagnosis not present

## 2022-09-25 DIAGNOSIS — Z419 Encounter for procedure for purposes other than remedying health state, unspecified: Secondary | ICD-10-CM | POA: Diagnosis not present

## 2022-09-30 DIAGNOSIS — R011 Cardiac murmur, unspecified: Secondary | ICD-10-CM | POA: Diagnosis not present

## 2022-09-30 DIAGNOSIS — R591 Generalized enlarged lymph nodes: Secondary | ICD-10-CM | POA: Diagnosis not present

## 2022-09-30 DIAGNOSIS — M6281 Muscle weakness (generalized): Secondary | ICD-10-CM | POA: Diagnosis not present

## 2022-09-30 DIAGNOSIS — M2550 Pain in unspecified joint: Secondary | ICD-10-CM | POA: Diagnosis not present

## 2022-09-30 DIAGNOSIS — R070 Pain in throat: Secondary | ICD-10-CM | POA: Diagnosis not present

## 2022-09-30 DIAGNOSIS — R5383 Other fatigue: Secondary | ICD-10-CM | POA: Diagnosis not present

## 2022-09-30 DIAGNOSIS — M791 Myalgia, unspecified site: Secondary | ICD-10-CM | POA: Diagnosis not present

## 2022-10-25 DIAGNOSIS — Z419 Encounter for procedure for purposes other than remedying health state, unspecified: Secondary | ICD-10-CM | POA: Diagnosis not present

## 2022-11-25 DIAGNOSIS — Z419 Encounter for procedure for purposes other than remedying health state, unspecified: Secondary | ICD-10-CM | POA: Diagnosis not present

## 2022-12-26 DIAGNOSIS — Z419 Encounter for procedure for purposes other than remedying health state, unspecified: Secondary | ICD-10-CM | POA: Diagnosis not present

## 2023-01-25 DIAGNOSIS — Z419 Encounter for procedure for purposes other than remedying health state, unspecified: Secondary | ICD-10-CM | POA: Diagnosis not present

## 2023-02-25 DIAGNOSIS — Z419 Encounter for procedure for purposes other than remedying health state, unspecified: Secondary | ICD-10-CM | POA: Diagnosis not present

## 2023-03-27 DIAGNOSIS — Z419 Encounter for procedure for purposes other than remedying health state, unspecified: Secondary | ICD-10-CM | POA: Diagnosis not present

## 2023-04-27 DIAGNOSIS — Z419 Encounter for procedure for purposes other than remedying health state, unspecified: Secondary | ICD-10-CM | POA: Diagnosis not present

## 2023-05-28 DIAGNOSIS — Z419 Encounter for procedure for purposes other than remedying health state, unspecified: Secondary | ICD-10-CM | POA: Diagnosis not present

## 2023-06-25 DIAGNOSIS — Z419 Encounter for procedure for purposes other than remedying health state, unspecified: Secondary | ICD-10-CM | POA: Diagnosis not present

## 2023-08-06 DIAGNOSIS — Z419 Encounter for procedure for purposes other than remedying health state, unspecified: Secondary | ICD-10-CM | POA: Diagnosis not present

## 2023-09-05 DIAGNOSIS — Z419 Encounter for procedure for purposes other than remedying health state, unspecified: Secondary | ICD-10-CM | POA: Diagnosis not present

## 2023-10-06 DIAGNOSIS — Z419 Encounter for procedure for purposes other than remedying health state, unspecified: Secondary | ICD-10-CM | POA: Diagnosis not present

## 2023-11-05 DIAGNOSIS — Z419 Encounter for procedure for purposes other than remedying health state, unspecified: Secondary | ICD-10-CM | POA: Diagnosis not present

## 2023-12-06 DIAGNOSIS — Z419 Encounter for procedure for purposes other than remedying health state, unspecified: Secondary | ICD-10-CM | POA: Diagnosis not present

## 2024-01-06 DIAGNOSIS — Z419 Encounter for procedure for purposes other than remedying health state, unspecified: Secondary | ICD-10-CM | POA: Diagnosis not present

## 2024-03-07 DIAGNOSIS — Z419 Encounter for procedure for purposes other than remedying health state, unspecified: Secondary | ICD-10-CM | POA: Diagnosis not present
